# Patient Record
Sex: Female | Born: 1990 | Race: Black or African American | Hispanic: No | Marital: Single | Smoking: Current every day smoker
Health system: Southern US, Community
[De-identification: ages and names within clinical notes are randomized; demographics above are authoritative.]

## PROBLEM LIST (undated history)

## (undated) DIAGNOSIS — J302 Other seasonal allergic rhinitis: Secondary | ICD-10-CM

## (undated) HISTORY — PX: TONSILLECTOMY: SUR1361

## (undated) HISTORY — PX: EAR CYST EXCISION: SHX22

---

## 2012-07-10 ENCOUNTER — Encounter (HOSPITAL_COMMUNITY): Payer: Self-pay | Admitting: Pharmacy Technician

## 2012-07-10 NOTE — H&P (Signed)
  NTS SOAP Note  Vital Signs:  Vitals as of: 07/05/2012: Systolic 136: Diastolic 81: Heart Rate 81: Temp 98.58F: Height 6ft 4.5in: Weight 222Lbs 0 Ounces: Pain Level 5: BMI 38  BMI : 37.52 kg/m2  Subjective: This 81 Years 2 Months old Female presents for of keloid of right ear.  Had piercing done on right external tragus in past, has now formed keloid scar.  Review of Symptoms:  Constitutional:unremarkable   Head:unremarkable    Eyes:unremarkable   Nose/Mouth/Throat:unremarkable Cardiovascular:  unremarkable   Respiratory:unremarkable   Gastrointestinal:  unremarkable   Genitourinary:unremarkable     Musculoskeletal:unremarkable   as noted Hematolgic/Lymphatic:unremarkable     Allergic/Immunologic:unremarkable     Past Medical History:    Reviewed   Past Medical History  Surgical History: none Medical Problems: none Allergies: nkda Medications: none   Social History:Reviewed  Social History  Preferred Language: English ([place]) Race:  Black or African American Ethnicity: Not Hispanic / Latino Age: 63 Years 2 Months Marital Status:  S Alcohol:  No Recreational drug(s):  No   Smoking Status: Never smoker reviewed on 07/05/2012  Family History:  Reviewed   Family History  Is there a family history [id number]    Objective Information: General:  Well appearing, well nourished in no distress.      Large punduculated keloid on outer external tragus, with small keloid noted inside and opposite the larger keloid. Heart:  RRR, no murmur Lungs:    CTA bilaterally, no wheezes, rhonchi, rales.  Breathing unlabored.  Assessment:Keloid, right ear  Diagnosis &amp; Procedure:    Plan:Can be removed, but patient warned that keloid could come back.  Will call to schedule surgery if she wants.   Patient Education:Alternative treatments to surgery were discussed with patient (and family).  Risks  and benefits  of procedure were fully explained to the patient (and family) who gave informed consent. Patient/family questions were addressed.  Follow-up:Pending Surgery

## 2012-07-12 ENCOUNTER — Encounter (HOSPITAL_COMMUNITY): Payer: Self-pay

## 2012-07-12 ENCOUNTER — Encounter (HOSPITAL_COMMUNITY)
Admission: RE | Admit: 2012-07-12 | Discharge: 2012-07-12 | Disposition: A | Discharge status: Home / Self Care | Payer: PRIVATE HEALTH INSURANCE | Source: Ambulatory Visit | Attending: General Surgery | Admitting: General Surgery

## 2012-07-12 HISTORY — DX: Other seasonal allergic rhinitis: J30.2

## 2012-07-12 LAB — SURGICAL PCR SCREEN: Staphylococcus aureus: POSITIVE — AB

## 2012-07-12 NOTE — Patient Instructions (Addendum)
    21 Jasmine Mccullough  07/12/2012   Your procedure is scheduled on:  07/16/2012  Report to [facility] at  615  AM.  Call this number if you have problems the morning of surgery: [phone]   Remember:   Do not eat food:After Midnight.  May have clear liquids:until Midnight .    Take these medicines the morning of surgery with A SIP OF WATER:  none   Do not wear jewelry, make-up or nail polish.  Do not wear lotions, powders, or perfumes. You may wear deodorant.  Do not shave 48 hours prior to surgery. Men may shave face and neck.  Do not bring valuables to the hospital.  Contacts, dentures or bridgework may not be worn into surgery.  Leave suitcase in the car. After surgery it may be brought to your room.  For patients admitted to the hospital, checkout time is 11:00 AM the day of discharge.   Patients discharged the day of surgery will not be allowed to drive home.  Name and phone number of your driver: family  Special Instructions: Shower using CHG 2 nights before surgery and the night before surgery.  If you shower the day of surgery use CHG.  Use special wash - you have one bottle of CHG for all showers.  You should use approximately 1/3 of the bottle for each shower.   Please read over the following fact sheets that you were given: Pain Booklet, MRSA Information, Surgical Site Infection Prevention, Anesthesia Post-op Instructions and Care and Recovery After Surgery PATIENT INSTRUCTIONS POST-ANESTHESIA  IMMEDIATELY FOLLOWING SURGERY:  Do not drive or operate machinery for the first twenty four hours after surgery.  Do not make any important decisions for twenty four hours after surgery or while taking narcotic pain medications or sedatives.  If you develop intractable nausea and vomiting or a severe headache please notify your doctor immediately.  FOLLOW-UP:  Please make an appointment with your surgeon as instructed. You do not need to follow up with anesthesia unless specifically  instructed to do so.  WOUND CARE INSTRUCTIONS (if applicable):  Keep a dry clean dressing on the anesthesia/puncture wound site if there is drainage.  Once the wound has quit draining you may leave it open to air.  Generally you should leave the bandage intact for twenty four hours unless there is drainage.  If the epidural site drains for more than 36-48 hours please call the anesthesia department.  QUESTIONS?:  Please feel free to call your physician or the hospital operator if you have any questions, and they will be happy to assist you.

## 2012-07-16 ENCOUNTER — Encounter (HOSPITAL_COMMUNITY): Payer: Self-pay | Admitting: Anesthesiology

## 2012-07-16 ENCOUNTER — Encounter (HOSPITAL_COMMUNITY)
Admission: RE | Disposition: A | Discharge status: Home / Self Care | Payer: Self-pay | Source: Ambulatory Visit | Attending: General Surgery

## 2012-07-16 ENCOUNTER — Ambulatory Visit (HOSPITAL_COMMUNITY): Payer: PRIVATE HEALTH INSURANCE | Admitting: Anesthesiology

## 2012-07-16 ENCOUNTER — Encounter (HOSPITAL_COMMUNITY): Payer: Self-pay | Admitting: *Deleted

## 2012-07-16 ENCOUNTER — Ambulatory Visit (HOSPITAL_COMMUNITY)
Admission: RE | Admit: 2012-07-16 | Discharge: 2012-07-16 | Disposition: A | Discharge status: Home / Self Care | Payer: PRIVATE HEALTH INSURANCE | Source: Ambulatory Visit | Attending: General Surgery | Admitting: General Surgery

## 2012-07-16 DIAGNOSIS — L910 Hypertrophic scar: Secondary | ICD-10-CM | POA: Insufficient documentation

## 2012-07-16 DIAGNOSIS — Z01812 Encounter for preprocedural laboratory examination: Secondary | ICD-10-CM | POA: Insufficient documentation

## 2012-07-16 SURGERY — EXCISION, KELOID
Anesthesia: General | Site: Ear | Laterality: Right | Wound class: Clean

## 2012-07-16 MED ORDER — MIDAZOLAM HCL 2 MG/2ML IJ SOLN
1.0000 mg | INTRAMUSCULAR | Status: DC | PRN
  Administered 2012-07-16: 2 mg via INTRAVENOUS

## 2012-07-16 MED ORDER — BUPIVACAINE HCL (PF) 0.5 % IJ SOLN
INTRAMUSCULAR | Status: AC
  Filled 2012-07-16: qty 30

## 2012-07-16 MED ORDER — FENTANYL CITRATE 0.05 MG/ML IJ SOLN
INTRAMUSCULAR | Status: AC
  Filled 2012-07-16: qty 2

## 2012-07-16 MED ORDER — FENTANYL CITRATE 0.05 MG/ML IJ SOLN
25.0000 ug | INTRAMUSCULAR | Status: DC | PRN
  Administered 2012-07-16: 50 ug via INTRAVENOUS

## 2012-07-16 MED ORDER — BACITRACIN-NEOMYCIN-POLYMYXIN 400-5-5000 EX OINT
TOPICAL_OINTMENT | CUTANEOUS | Status: AC
Start: 2012-07-16 — End: 2012-07-16
  Filled 2012-07-16: qty 1

## 2012-07-16 MED ORDER — PROPOFOL 10 MG/ML IV BOLUS
INTRAVENOUS | Status: DC | PRN
  Administered 2012-07-16: 50 mg via INTRAVENOUS
  Administered 2012-07-16: 150 mg via INTRAVENOUS

## 2012-07-16 MED ORDER — FENTANYL CITRATE 0.05 MG/ML IJ SOLN
INTRAMUSCULAR | Status: DC | PRN
  Administered 2012-07-16: 100 ug via INTRAVENOUS

## 2012-07-16 MED ORDER — MIDAZOLAM HCL 2 MG/2ML IJ SOLN
INTRAMUSCULAR | Status: AC
  Filled 2012-07-16: qty 2

## 2012-07-16 MED ORDER — HYDROCODONE-ACETAMINOPHEN 5-325 MG PO TABS: 1.0000 | ORAL_TABLET | ORAL | Status: DC | PRN

## 2012-07-16 MED ORDER — LIDOCAINE HCL (PF) 1 % IJ SOLN
INTRAMUSCULAR | Status: AC
  Filled 2012-07-16: qty 5

## 2012-07-16 MED ORDER — BACITRACIN ZINC 500 UNIT/GM EX OINT
TOPICAL_OINTMENT | CUTANEOUS | Status: AC
  Filled 2012-07-16: qty 0.9

## 2012-07-16 MED ORDER — LACTATED RINGERS IV SOLN
INTRAVENOUS | Status: DC
  Administered 2012-07-16: 1000 mL via INTRAVENOUS

## 2012-07-16 MED ORDER — LIDOCAINE HCL (CARDIAC) 10 MG/ML IV SOLN
INTRAVENOUS | Status: DC | PRN
  Administered 2012-07-16: 20 mg via INTRAVENOUS

## 2012-07-16 MED ORDER — SODIUM CHLORIDE 0.9 % IR SOLN
Status: DC | PRN
  Administered 2012-07-16: 1000 mL

## 2012-07-16 MED ORDER — LIDOCAINE HCL (PF) 1 % IJ SOLN
INTRAMUSCULAR | Status: AC
  Filled 2012-07-16: qty 30

## 2012-07-16 MED ORDER — ONDANSETRON HCL 4 MG/2ML IJ SOLN: 4.0000 mg | Freq: Once | INTRAMUSCULAR | Status: DC | PRN

## 2012-07-16 MED ORDER — PROPOFOL 10 MG/ML IV EMUL
INTRAVENOUS | Status: AC
  Filled 2012-07-16: qty 20

## 2012-07-16 MED ORDER — KETOROLAC TROMETHAMINE 30 MG/ML IJ SOLN
30.0000 mg | Freq: Once | INTRAMUSCULAR | Status: AC
  Administered 2012-07-16: 30 mg via INTRAVENOUS

## 2012-07-16 MED ORDER — KETOROLAC TROMETHAMINE 30 MG/ML IJ SOLN
INTRAMUSCULAR | Status: AC
  Filled 2012-07-16: qty 1

## 2012-07-16 SURGICAL SUPPLY — 33 items
BAG HAMPER (MISCELLANEOUS) ×2 IMPLANT
CLOTH BEACON ORANGE TIMEOUT ST (SAFETY) ×2 IMPLANT
COTTON BALL STERILE (GAUZE/BANDAGES/DRESSINGS) ×2
COTTON BALL STERILE 2 PK (GAUZE/BANDAGES/DRESSINGS) ×1 IMPLANT
COVER LIGHT HANDLE STERIS (MISCELLANEOUS) ×4 IMPLANT
DECANTER SPIKE VIAL GLASS SM (MISCELLANEOUS) ×2 IMPLANT
DERMABOND ADVANCED (GAUZE/BANDAGES/DRESSINGS)
DERMABOND ADVANCED .7 DNX12 (GAUZE/BANDAGES/DRESSINGS) IMPLANT
ELECT NEEDLE TIP 2.8 STRL (NEEDLE) ×2 IMPLANT
ELECT REM PT RETURN 9FT ADLT (ELECTROSURGICAL) ×2
ELECTRODE REM PT RTRN 9FT ADLT (ELECTROSURGICAL) ×1 IMPLANT
FORMALIN 10 PREFIL 120ML (MISCELLANEOUS) ×2 IMPLANT
GLOVE BIO SURGEON STRL SZ7.5 (GLOVE) ×2 IMPLANT
GLOVE BIOGEL PI IND STRL 7.5 (GLOVE) ×2 IMPLANT
GLOVE BIOGEL PI INDICATOR 7.5 (GLOVE) ×2
GLOVE ECLIPSE 7.0 STRL STRAW (GLOVE) ×4 IMPLANT
GLOVE EXAM NITRILE MD LF STRL (GLOVE) ×2 IMPLANT
GOWN STRL REIN XL XLG (GOWN DISPOSABLE) ×4 IMPLANT
KIT ROOM TURNOVER APOR (KITS) ×2 IMPLANT
MANIFOLD NEPTUNE II (INSTRUMENTS) ×2 IMPLANT
NEEDLE HYPO 25X1 1.5 SAFETY (NEEDLE) IMPLANT
NS IRRIG 1000ML POUR BTL (IV SOLUTION) ×2 IMPLANT
PACK MINOR (CUSTOM PROCEDURE TRAY) ×2 IMPLANT
PAD ARMBOARD 7.5X6 YLW CONV (MISCELLANEOUS) ×2 IMPLANT
SET BASIN LINEN APH (SET/KITS/TRAYS/PACK) ×2 IMPLANT
SUT ETHILON 3 0 FSL (SUTURE) IMPLANT
SUT ETHILON 5 0 P 3 18 (SUTURE) ×2
SUT NYLON ETHILON 5-0 P-3 1X18 (SUTURE) ×2 IMPLANT
SUT PROLENE 4 0 PS 2 18 (SUTURE) IMPLANT
SUT VIC AB 3-0 SH 27 (SUTURE)
SUT VIC AB 3-0 SH 27X BRD (SUTURE) IMPLANT
SUT VIC AB 4-0 PS2 27 (SUTURE) IMPLANT
SYR CONTROL 10ML LL (SYRINGE) IMPLANT

## 2012-07-16 NOTE — Anesthesia Postprocedure Evaluation (Signed)
Anesthesia Post Note  Patient: Jasmine Mccullough  Procedure(s) Performed: Procedure(s) (LRB): EXCISION OF KELOID (Right)  Anesthesia type: General  Patient location: PACU  Post pain: Pain level controlled  Post assessment: Post-op Vital signs reviewed, Patient's Cardiovascular Status Stable, Respiratory Function Stable, Patent Airway, No signs of Nausea or vomiting and Pain level controlled  Last Vitals:  Filed Vitals:   07/16/12 0828  BP: 120/41  Pulse: 88  Temp: 37 C  Resp: 20    Post vital signs: Reviewed and stable  Level of consciousness: awake and alert   Complications: No apparent anesthesia complications

## 2012-07-16 NOTE — Anesthesia Preprocedure Evaluation (Signed)
Anesthesia Evaluation  Patient identified by MRN, date of birth, ID band Patient awake    Reviewed: Allergy & Precautions, H&P , NPO status , Patient's Chart, lab work & pertinent test results  History of Anesthesia Complications Negative for: history of anesthetic complications  Airway Mallampati: II TM Distance: >3 FB     Dental  (+) Teeth Intact   Pulmonary neg pulmonary ROS,  breath sounds clear to auscultation        Cardiovascular Rhythm:Regular     Neuro/Psych    GI/Hepatic negative GI ROS,   Endo/Other    Renal/GU      Musculoskeletal   Abdominal   Peds  Hematology   Anesthesia Other Findings   Reproductive/Obstetrics                           Anesthesia Physical Anesthesia Plan  ASA: I  Anesthesia Plan: General   Post-op Pain Management:    Induction: Intravenous  Airway Management Planned: LMA  Additional Equipment:   Intra-op Plan:   Post-operative Plan: Extubation in OR  Informed Consent: I have reviewed the patients History and Physical, chart, labs and discussed the procedure including the risks, benefits and alternatives for the proposed anesthesia with the patient or authorized representative who has indicated his/her understanding and acceptance.     Plan Discussed with:   Anesthesia Plan Comments:         Anesthesia Quick Evaluation

## 2012-07-16 NOTE — Op Note (Signed)
Patient:  [name]  DOB:  December 04, 1990  MRN:  [id number]   Preop Diagnosis:  Keloid, right ear  Postop Diagnosis:  Same  Procedure:  Excision of keloid, right ear  Surgeon:  Franky Macho, M.D.  Anes:  General  Indications:  Patient is a 21 year old black female who had a piercing along the superior aspect of the right ear through the cartilage who end up developing keloids on either side of the piercing. She has long since removed the piercing. She now comes the operating room for excision of the keloid. The risks and benefits of the procedure including the possibility of recurrence of the keloid were fully explained to the patient, gave informed consent.  Procedure note:  Patient is placed the supine position. After general anesthesia was administered, the right ear was prepped and draped using the usual sterile technique with Betadine. Surgical site confirmation was performed.  Both the larger hour and smaller inner keloids were excised using Bovie electrocautery. Further dissection was performed using sharp dissection in order to fully remove the keloid. Care was taken to avoid the cartilage. The outer skin was reapproximated using 5-0 nylon sutures. Neosporin ointment was applied.  All tape and needle counts were correct at the end of the procedure. The patient was awakened and transferred to PACU in stable condition.  Complications:  None  EBL:  None  Specimen:  Keloid, right ear

## 2012-07-16 NOTE — Interval H&P Note (Signed)
History and Physical Interval Note:  07/16/2012 7:26 AM  Jasmine Mccullough  has presented today for surgery, with the diagnosis of Keloid Right Ear  The various methods of treatment have been discussed with the patient and family. After consideration of risks, benefits and other options for treatment, the patient has consented to  Procedure(s) (LRB) with comments: EXCISION OF KELOID (Right) as a surgical intervention .  The patient's history has been reviewed, patient examined, no change in status, stable for surgery.  I have reviewed the patient's chart and labs.  Questions were answered to the patient's satisfaction.     Franky Macho A

## 2012-07-16 NOTE — Anesthesia Procedure Notes (Signed)
Procedure Name: LMA Insertion Date/Time: 07/16/2012 7:53 AM Performed by: Franco Nones Pre-anesthesia Checklist: Patient identified, Patient being monitored, Emergency Drugs available, Timeout performed and Suction available Patient Re-evaluated:Patient Re-evaluated prior to inductionOxygen Delivery Method: Circle System Utilized Preoxygenation: Pre-oxygenation with 100% oxygen Intubation Type: IV induction Ventilation: Mask ventilation without difficulty LMA: LMA inserted LMA Size: 4.0 Number of attempts: 1 Placement Confirmation: positive ETCO2 and breath sounds checked- equal and bilateral Tube secured with: Tape

## 2012-07-16 NOTE — Transfer of Care (Signed)
Immediate Anesthesia Transfer of Care Note  Patient: Jasmine Mccullough  Procedure(s) Performed: Procedure(s) (LRB): EXCISION OF KELOID (Right)  Patient Location: PACU  Anesthesia Type: General  Level of Consciousness: awake  Airway & Oxygen Therapy: Patient Spontanous Breathing and non-rebreather face mask  Post-op Assessment: Report given to PACU RN, Post -op Vital signs reviewed and stable and Patient moving all extremities  Post vital signs: Reviewed and stable  Complications: No apparent anesthesia complications

## 2012-07-17 MED ORDER — BACITRACIN-NEOMYCIN-POLYMYXIN 400-5-5000 EX OINT
TOPICAL_OINTMENT | CUTANEOUS | Status: AC | PRN
  Administered 2012-07-16: 1 via TOPICAL

## 2015-05-30 ENCOUNTER — Emergency Department (HOSPITAL_COMMUNITY): Payer: Medicaid Other

## 2015-05-30 ENCOUNTER — Inpatient Hospital Stay (HOSPITAL_COMMUNITY)
Admission: EM | Admit: 2015-05-30 | Discharge: 2015-06-03 | DRG: 777 | Disposition: A | Discharge status: Home / Self Care | Payer: Medicaid Other | Attending: Obstetrics & Gynecology | Admitting: Obstetrics & Gynecology

## 2015-05-30 ENCOUNTER — Encounter (HOSPITAL_COMMUNITY): Payer: Self-pay | Admitting: Emergency Medicine

## 2015-05-30 ENCOUNTER — Emergency Department (HOSPITAL_COMMUNITY): Payer: Medicaid Other | Admitting: Anesthesiology

## 2015-05-30 ENCOUNTER — Encounter (HOSPITAL_COMMUNITY)
Admission: EM | Disposition: A | Discharge status: Home / Self Care | Payer: Self-pay | Source: Home / Self Care | Attending: Obstetrics & Gynecology

## 2015-05-30 DIAGNOSIS — K913 Postprocedural intestinal obstruction: Secondary | ICD-10-CM | POA: Diagnosis not present

## 2015-05-30 DIAGNOSIS — O001 Tubal pregnancy: Secondary | ICD-10-CM | POA: Diagnosis present

## 2015-05-30 DIAGNOSIS — Z6841 Body Mass Index (BMI) 40.0 and over, adult: Secondary | ICD-10-CM | POA: Diagnosis not present

## 2015-05-30 DIAGNOSIS — O00101 Right tubal pregnancy without intrauterine pregnancy: Secondary | ICD-10-CM

## 2015-05-30 DIAGNOSIS — O2690 Pregnancy related conditions, unspecified, unspecified trimester: Secondary | ICD-10-CM

## 2015-05-30 DIAGNOSIS — D62 Acute posthemorrhagic anemia: Secondary | ICD-10-CM | POA: Diagnosis not present

## 2015-05-30 DIAGNOSIS — F17200 Nicotine dependence, unspecified, uncomplicated: Secondary | ICD-10-CM | POA: Diagnosis present

## 2015-05-30 DIAGNOSIS — K661 Hemoperitoneum: Secondary | ICD-10-CM | POA: Diagnosis present

## 2015-05-30 DIAGNOSIS — O009 Ectopic pregnancy, unspecified: Secondary | ICD-10-CM

## 2015-05-30 DIAGNOSIS — Z3A09 9 weeks gestation of pregnancy: Secondary | ICD-10-CM | POA: Diagnosis present

## 2015-05-30 DIAGNOSIS — K567 Ileus, unspecified: Secondary | ICD-10-CM | POA: Diagnosis not present

## 2015-05-30 DIAGNOSIS — D6489 Other specified anemias: Secondary | ICD-10-CM

## 2015-05-30 DIAGNOSIS — I959 Hypotension, unspecified: Secondary | ICD-10-CM

## 2015-05-30 DIAGNOSIS — K9189 Other postprocedural complications and disorders of digestive system: Secondary | ICD-10-CM

## 2015-05-30 HISTORY — PX: LAPAROSCOPY: SHX197

## 2015-05-30 LAB — CBC
HCT: 30 % — ABNORMAL LOW (ref 36.0–46.0)
HEMATOCRIT: 31.2 % — AB (ref 36.0–46.0)
HEMOGLOBIN: 10 g/dL — AB (ref 12.0–15.0)
Hemoglobin: 9.7 g/dL — ABNORMAL LOW (ref 12.0–15.0)
MCH: 26.5 pg (ref 26.0–34.0)
MCH: 26.7 pg (ref 26.0–34.0)
MCHC: 32.3 g/dL (ref 30.0–36.0)
MCHC: 32.1 g/dL (ref 30.0–36.0)
MCV: 82 fL (ref 78.0–100.0)
MCV: 83.2 fL (ref 78.0–100.0)
PLATELETS: 318 10*3/uL (ref 150–400)
Platelets: 242 10*3/uL (ref 150–400)
RBC: 3.66 MIL/uL — ABNORMAL LOW (ref 3.87–5.11)
RBC: 3.75 MIL/uL — ABNORMAL LOW (ref 3.87–5.11)
RDW: 15.3 % (ref 11.5–15.5)
RDW: 16.6 % — AB (ref 11.5–15.5)
WBC: 23.3 10*3/uL — AB (ref 4.0–10.5)
WBC: 17 10*3/uL — AB (ref 4.0–10.5)

## 2015-05-30 LAB — I-STAT CHEM 8, ED
BUN: 7 mg/dL (ref 6–20)
Calcium, Ion: 1.09 mmol/L — ABNORMAL LOW (ref 1.12–1.23)
Chloride: 107 mmol/L (ref 101–111)
Creatinine, Ser: 0.7 mg/dL (ref 0.44–1.00)
Glucose, Bld: 137 mg/dL — ABNORMAL HIGH (ref 65–99)
HCT: 25 % — ABNORMAL LOW (ref 36.0–46.0)
HEMOGLOBIN: 8.5 g/dL — AB (ref 12.0–15.0)
Potassium: 4.3 mmol/L (ref 3.5–5.1)
Sodium: 141 mmol/L (ref 135–145)
TCO2: 20 mmol/L (ref 0–100)

## 2015-05-30 LAB — URINALYSIS W MICROSCOPIC (NOT AT ARMC)
Glucose, UA: 100 mg/dL — AB
Hgb urine dipstick: NEGATIVE
Ketones, ur: NEGATIVE mg/dL
Leukocytes, UA: NEGATIVE
NITRITE: NEGATIVE
PH: 5.5 (ref 5.0–8.0)
PROTEIN: 100 mg/dL — AB
Specific Gravity, Urine: >1.03 — ABNORMAL HIGH (ref 1.005–1.030)
Urobilinogen, UA: 1 mg/dL (ref 0.0–1.0)

## 2015-05-30 LAB — COMPREHENSIVE METABOLIC PANEL
ALK PHOS: 50 U/L (ref 38–126)
ALT: 16 U/L (ref 14–54)
ANION GAP: 7 (ref 5–15)
AST: 17 U/L (ref 15–41)
Albumin: 3.4 g/dL — ABNORMAL LOW (ref 3.5–5.0)
BUN: 8 mg/dL (ref 6–20)
CHLORIDE: 107 mmol/L (ref 101–111)
CO2: 23 mmol/L (ref 22–32)
CREATININE: 1.1 mg/dL — AB (ref 0.44–1.00)
Calcium: 8.4 mg/dL — ABNORMAL LOW (ref 8.9–10.3)
GFR calc Af Amer: >60 mL/min (ref >60)
GLUCOSE: 226 mg/dL — AB (ref 65–99)
Potassium: 3.6 mmol/L (ref 3.5–5.1)
Sodium: 137 mmol/L (ref 135–145)
Total Bilirubin: 0.2 mg/dL — ABNORMAL LOW (ref 0.3–1.2)
Total Protein: 6.1 g/dL — ABNORMAL LOW (ref 6.5–8.1)

## 2015-05-30 LAB — PREPARE RBC (CROSSMATCH)

## 2015-05-30 LAB — ABO/RH
ABO/RH(D): O POS
ABO/RH(D): O POS

## 2015-05-30 LAB — POC URINE PREG, ED: Preg Test, Ur: POSITIVE — AB

## 2015-05-30 LAB — HCG, QUANTITATIVE, PREGNANCY: HCG, BETA CHAIN, QUANT, S: 20710 m[IU]/mL — AB (ref <5)

## 2015-05-30 LAB — LACTIC ACID, PLASMA
LACTIC ACID, VENOUS: 3.5 mmol/L — AB (ref 0.5–2.0)
Lactic Acid, Venous: 3.2 mmol/L (ref 0.5–2.0)

## 2015-05-30 SURGERY — LAPAROSCOPY OPERATIVE
Anesthesia: General | Site: Abdomen

## 2015-05-30 MED ORDER — OXYCODONE-ACETAMINOPHEN 5-325 MG PO TABS
1.0000 | ORAL_TABLET | ORAL | Status: DC | PRN
  Administered 2015-05-31 – 2015-06-02 (×8): 2 via ORAL
  Administered 2015-06-02 – 2015-06-03 (×2): 1 via ORAL
  Filled 2015-05-30: qty 1
  Filled 2015-05-30 (×3): qty 2
  Filled 2015-05-30: qty 1
  Filled 2015-05-30 (×5): qty 2
  Filled 2015-05-30: qty 1

## 2015-05-30 MED ORDER — PHENYLEPHRINE HCL 10 MG/ML IJ SOLN: INTRAMUSCULAR | Status: DC | PRN

## 2015-05-30 MED ORDER — DEXAMETHASONE SODIUM PHOSPHATE 10 MG/ML IJ SOLN
INTRAMUSCULAR | Status: DC | PRN
  Administered 2015-05-30: 4 mg via INTRAVENOUS

## 2015-05-30 MED ORDER — SODIUM CHLORIDE 0.9 % IV SOLN: 10.0000 mL/h | Freq: Once | INTRAVENOUS | Status: DC

## 2015-05-30 MED ORDER — ONDANSETRON HCL 4 MG/2ML IJ SOLN
INTRAMUSCULAR | Status: DC | PRN
  Administered 2015-05-30: 4 mg via INTRAVENOUS

## 2015-05-30 MED ORDER — NEOSTIGMINE METHYLSULFATE 10 MG/10ML IV SOLN
INTRAVENOUS | Status: DC | PRN
  Administered 2015-05-30: 4 mg via INTRAVENOUS

## 2015-05-30 MED ORDER — HYDROMORPHONE 0.3 MG/ML IV SOLN
INTRAVENOUS | Status: DC
  Administered 2015-05-30: 0.6 mg via INTRAVENOUS
  Administered 2015-05-31: 1.5 mg via INTRAVENOUS
  Administered 2015-05-31: 0.9 mg via INTRAVENOUS
  Filled 2015-05-30: qty 25

## 2015-05-30 MED ORDER — DIPHENHYDRAMINE HCL 50 MG/ML IJ SOLN: 12.5000 mg | Freq: Four times a day (QID) | INTRAMUSCULAR | Status: DC | PRN

## 2015-05-30 MED ORDER — FENTANYL CITRATE (PF) 100 MCG/2ML IJ SOLN
INTRAMUSCULAR | Status: DC | PRN
  Administered 2015-05-30: 25 ug via INTRAVENOUS
  Administered 2015-05-30 (×2): 50 ug via INTRAVENOUS
  Administered 2015-05-30: 150 ug via INTRAVENOUS
  Administered 2015-05-30 (×2): 50 ug via INTRAVENOUS
  Administered 2015-05-30 (×3): 25 ug via INTRAVENOUS
  Administered 2015-05-30: 100 ug via INTRAVENOUS

## 2015-05-30 MED ORDER — BUPIVACAINE HCL (PF) 0.5 % IJ SOLN
INTRAMUSCULAR | Status: DC | PRN
  Administered 2015-05-30: 30 mL

## 2015-05-30 MED ORDER — DIPHENHYDRAMINE HCL 12.5 MG/5ML PO ELIX: 12.5000 mg | ORAL_SOLUTION | Freq: Four times a day (QID) | ORAL | Status: DC | PRN

## 2015-05-30 MED ORDER — SUCCINYLCHOLINE CHLORIDE 20 MG/ML IJ SOLN
INTRAMUSCULAR | Status: DC | PRN
  Administered 2015-05-30: 120 mg via INTRAVENOUS

## 2015-05-30 MED ORDER — KETOROLAC TROMETHAMINE 30 MG/ML IJ SOLN: 30.0000 mg | Freq: Once | INTRAMUSCULAR | Status: DC

## 2015-05-30 MED ORDER — MENTHOL 3 MG MT LOZG
1.0000 | LOZENGE | OROMUCOSAL | Status: DC | PRN
  Filled 2015-05-30: qty 9

## 2015-05-30 MED ORDER — SODIUM CHLORIDE 0.9 % IV BOLUS (SEPSIS)
1000.0000 mL | Freq: Once | INTRAVENOUS | Status: AC
  Administered 2015-05-30: 1000 mL via INTRAVENOUS

## 2015-05-30 MED ORDER — CEFAZOLIN SODIUM-DEXTROSE 2-3 GM-% IV SOLR
2.0000 g | INTRAVENOUS | Status: DC
  Filled 2015-05-30: qty 50

## 2015-05-30 MED ORDER — ONDANSETRON HCL 4 MG/2ML IJ SOLN
4.0000 mg | Freq: Once | INTRAMUSCULAR | Status: AC
  Administered 2015-05-30: 4 mg via INTRAVENOUS
  Filled 2015-05-30: qty 2

## 2015-05-30 MED ORDER — ONDANSETRON HCL 4 MG PO TABS: 4.0000 mg | ORAL_TABLET | Freq: Four times a day (QID) | ORAL | Status: DC | PRN

## 2015-05-30 MED ORDER — ONDANSETRON HCL 4 MG/2ML IJ SOLN: 4.0000 mg | Freq: Four times a day (QID) | INTRAMUSCULAR | Status: DC | PRN

## 2015-05-30 MED ORDER — FENTANYL CITRATE (PF) 100 MCG/2ML IJ SOLN
50.0000 ug | Freq: Once | INTRAMUSCULAR | Status: AC
  Administered 2015-05-30: 50 ug via INTRAVENOUS
  Filled 2015-05-30: qty 2

## 2015-05-30 MED ORDER — PROPOFOL 10 MG/ML IV BOLUS
INTRAVENOUS | Status: DC | PRN
  Administered 2015-05-30: 200 mg via INTRAVENOUS

## 2015-05-30 MED ORDER — SIMETHICONE 80 MG PO CHEW
80.0000 mg | CHEWABLE_TABLET | Freq: Four times a day (QID) | ORAL | Status: DC | PRN
  Administered 2015-05-31: 80 mg via ORAL
  Filled 2015-05-30: qty 1

## 2015-05-30 MED ORDER — PROMETHAZINE HCL 25 MG/ML IJ SOLN: 6.2500 mg | INTRAMUSCULAR | Status: DC | PRN

## 2015-05-30 MED ORDER — ROCURONIUM BROMIDE 100 MG/10ML IV SOLN
INTRAVENOUS | Status: DC | PRN
  Administered 2015-05-30 (×2): 20 mg via INTRAVENOUS

## 2015-05-30 MED ORDER — GLYCOPYRROLATE 0.2 MG/ML IJ SOLN
INTRAMUSCULAR | Status: DC | PRN
  Administered 2015-05-30: 0.6 mg via INTRAVENOUS

## 2015-05-30 MED ORDER — HYDROMORPHONE HCL 1 MG/ML IJ SOLN: 0.2500 mg | INTRAMUSCULAR | Status: DC | PRN

## 2015-05-30 MED ORDER — PANTOPRAZOLE SODIUM 40 MG PO TBEC
40.0000 mg | DELAYED_RELEASE_TABLET | Freq: Every day | ORAL | Status: DC
  Administered 2015-05-31 – 2015-06-03 (×4): 40 mg via ORAL
  Filled 2015-05-30 (×4): qty 1

## 2015-05-30 MED ORDER — LACTATED RINGERS IV SOLN
INTRAVENOUS | Status: DC
  Administered 2015-05-31: 04:00:00 via INTRAVENOUS

## 2015-05-30 MED ORDER — MEPERIDINE HCL 25 MG/ML IJ SOLN: 6.2500 mg | INTRAMUSCULAR | Status: DC | PRN

## 2015-05-30 MED ORDER — IBUPROFEN 600 MG PO TABS
600.0000 mg | ORAL_TABLET | Freq: Four times a day (QID) | ORAL | Status: DC | PRN
  Administered 2015-05-31 – 2015-06-02 (×8): 600 mg via ORAL
  Filled 2015-05-30 (×8): qty 1

## 2015-05-30 MED ORDER — CEFAZOLIN SODIUM-DEXTROSE 2-3 GM-% IV SOLR
INTRAVENOUS | Status: DC | PRN
  Administered 2015-05-30: 2 g via INTRAVENOUS

## 2015-05-30 MED ORDER — LIDOCAINE HCL (CARDIAC) 20 MG/ML IV SOLN
INTRAVENOUS | Status: DC | PRN
  Administered 2015-05-30: 100 mg via INTRAVENOUS

## 2015-05-30 MED ORDER — MIDAZOLAM HCL 2 MG/2ML IJ SOLN
INTRAMUSCULAR | Status: DC | PRN
  Administered 2015-05-30 (×2): 1 mg via INTRAVENOUS

## 2015-05-30 MED ORDER — PHENYLEPHRINE HCL 10 MG/ML IJ SOLN
INTRAMUSCULAR | Status: DC | PRN
  Administered 2015-05-30 (×5): 40 ug via INTRAVENOUS
  Administered 2015-05-30: 80 ug via INTRAVENOUS
  Administered 2015-05-30: 40 ug via INTRAVENOUS
  Administered 2015-05-30: 80 ug via INTRAVENOUS
  Administered 2015-05-30: 40 ug via INTRAVENOUS
  Administered 2015-05-30: 80 ug via INTRAVENOUS
  Administered 2015-05-30: 40 ug via INTRAVENOUS
  Administered 2015-05-30 (×3): 80 ug via INTRAVENOUS

## 2015-05-30 MED ORDER — HYDROMORPHONE HCL 1 MG/ML IJ SOLN: 0.2000 mg | INTRAMUSCULAR | Status: DC | PRN

## 2015-05-30 MED ORDER — SODIUM CHLORIDE 0.9 % IJ SOLN: 9.0000 mL | INTRAMUSCULAR | Status: DC | PRN

## 2015-05-30 MED ORDER — NALOXONE HCL 0.4 MG/ML IJ SOLN: 0.4000 mg | INTRAMUSCULAR | Status: DC | PRN

## 2015-05-30 MED ORDER — LACTATED RINGERS IV SOLN
INTRAVENOUS | Status: DC | PRN
Start: 2015-05-30 — End: 2015-05-31
  Administered 2015-05-30: 18:00:00 via INTRAVENOUS

## 2015-05-30 MED ORDER — FENTANYL CITRATE (PF) 250 MCG/5ML IJ SOLN
INTRAMUSCULAR | Status: AC
Start: 2015-05-30 — End: 2015-05-30
  Filled 2015-05-30: qty 25

## 2015-05-30 MED ORDER — SODIUM CHLORIDE 0.9 % IV SOLN
INTRAVENOUS | Status: DC
  Administered 2015-05-30 (×2): via INTRAVENOUS

## 2015-05-30 SURGICAL SUPPLY — 38 items
CABLE HIGH FREQUENCY MONO STRZ (ELECTRODE) IMPLANT
CELLS DAT CNTRL 66122 CELL SVR (MISCELLANEOUS) ×1 IMPLANT
CLOTH BEACON ORANGE TIMEOUT ST (SAFETY) ×3 IMPLANT
COUNTER NEEDLE 1200 MAGNETIC (NEEDLE) ×3 IMPLANT
COVER TABLE BACK 60X90 (DRAPES) ×3 IMPLANT
DRSG COVADERM PLUS 2X2 (GAUZE/BANDAGES/DRESSINGS) ×3 IMPLANT
DRSG OPSITE POSTOP 3X4 (GAUZE/BANDAGES/DRESSINGS) ×3 IMPLANT
DURAPREP 26ML APPLICATOR (WOUND CARE) ×3 IMPLANT
GLOVE BIOGEL PI IND STRL 7.0 (GLOVE) ×2 IMPLANT
GLOVE BIOGEL PI INDICATOR 7.0 (GLOVE) ×4
GLOVE ECLIPSE 7.0 STRL STRAW (GLOVE) ×3 IMPLANT
GOWN STRL REUS W/TWL LRG LVL3 (GOWN DISPOSABLE) ×9 IMPLANT
LIQUID BAND (GAUZE/BANDAGES/DRESSINGS) ×3 IMPLANT
NEEDLE INSUFFLATION 120MM (ENDOMECHANICALS) ×3 IMPLANT
NS IRRIG 1000ML POUR BTL (IV SOLUTION) ×3 IMPLANT
PACK LAPAROSCOPY BASIN (CUSTOM PROCEDURE TRAY) ×3 IMPLANT
PAD ABD 8X7 1/2 STERILE (GAUZE/BANDAGES/DRESSINGS) ×3 IMPLANT
PAD POSITIONING PINK XL (MISCELLANEOUS) ×3 IMPLANT
PENCIL BUTTON HOLSTER BLD 10FT (ELECTRODE) ×3 IMPLANT
POUCH SPECIMEN RETRIEVAL 10MM (ENDOMECHANICALS) IMPLANT
RTRCTR WOUND ALEXIS 18CM MED (MISCELLANEOUS) ×3
SET IRRIG TUBING LAPAROSCOPIC (IRRIGATION / IRRIGATOR) IMPLANT
SHEARS HARMONIC ACE PLUS 36CM (ENDOMECHANICALS) IMPLANT
SHEET LAVH (DRAPES) ×3 IMPLANT
SPONGE GAUZE 4X4 12PLY STER LF (GAUZE/BANDAGES/DRESSINGS) ×3 IMPLANT
SPONGE LAP 18X18 X RAY DECT (DISPOSABLE) ×12 IMPLANT
SUT VIC AB 0 CT1 27 (SUTURE) ×16
SUT VIC AB 0 CT1 27XBRD ANBCTR (SUTURE) ×8 IMPLANT
SUT VICRYL 0 UR6 27IN ABS (SUTURE) ×6 IMPLANT
SUT VICRYL 4-0 PS2 18IN ABS (SUTURE) ×3 IMPLANT
TOWEL OR 17X24 6PK STRL BLUE (TOWEL DISPOSABLE) ×6 IMPLANT
TRAY FOLEY CATH SILVER 14FR (SET/KITS/TRAYS/PACK) ×3 IMPLANT
TROCAR XCEL NON-BLD 11X100MML (ENDOMECHANICALS) ×3 IMPLANT
TROCAR XCEL NON-BLD 5MMX100MML (ENDOMECHANICALS) ×6 IMPLANT
TUBING SUCTION BULK 100 FT (MISCELLANEOUS) ×3 IMPLANT
WARMER LAPAROSCOPE (MISCELLANEOUS) ×3 IMPLANT
WATER STERILE IRR 1000ML POUR (IV SOLUTION) IMPLANT
YANKAUER SUCT BULB TIP NO VENT (SUCTIONS) ×3 IMPLANT

## 2015-05-30 NOTE — MAU Note (Signed)
Patient presents from [facility] via EMS stretcher with ruptured ectopic on the right side. Blood hanging.

## 2015-05-30 NOTE — Anesthesia Preprocedure Evaluation (Signed)
Anesthesia Evaluation  Patient identified by MRN, date of birth, ID band Patient awake    Reviewed: Allergy & Precautions, H&P , Patient's Chart, lab work & pertinent test results  Airway Mallampati: II  TM Distance: >3 FB Neck ROM: full    Dental no notable dental hx. (+) Teeth Intact   Pulmonary Current Smoker,    Pulmonary exam normal       Cardiovascular negative cardio ROS      Neuro/Psych negative neurological ROS  negative psych ROS   GI/Hepatic negative GI ROS, Neg liver ROS,   Endo/Other  Morbid obesity  Renal/GU negative Renal ROS     Musculoskeletal   Abdominal (+) + obese,   Peds  Hematology negative hematology ROS (+)   Anesthesia Other Findings   Reproductive/Obstetrics negative OB ROS                             Anesthesia Physical Anesthesia Plan  ASA: III and emergent  Anesthesia Plan: General   Post-op Pain Management:    Induction: Intravenous  Airway Management Planned: Oral ETT  Additional Equipment:   Intra-op Plan:   Post-operative Plan: Extubation in OR  Informed Consent: I have reviewed the patients History and Physical, chart, labs and discussed the procedure including the risks, benefits and alternatives for the proposed anesthesia with the patient or authorized representative who has indicated his/her understanding and acceptance.   Dental Advisory Given  Plan Discussed with: CRNA and Surgeon  Anesthesia Plan Comments:         Anesthesia Quick Evaluation

## 2015-05-30 NOTE — ED Notes (Signed)
Pt remains in ultrasound.

## 2015-05-30 NOTE — ED Notes (Signed)
Pt transferred at 1639 from [facility] ED. Emergent blood started at this time. This RN accompanied [facility] with pt transport. V/S were obtained en route at appropriate intervals see [facility] documentation. No infusion reaction noted. Pt tolerated transfer well. Report given to receiving RN at time of arrival to [facility] approx 1715.  nad noted.

## 2015-05-30 NOTE — Op Note (Signed)
PROCEDURE DATE:05/30/2015  PREOPERATIVE DIAGNOSIS:  Ruptured right ectopic pregnancy at [redacted]w[redacted]d with large amount of hemoperitoneum POSTOPERATIVE DIAGNOSIS:  The same SURGEON:   Jaynie Collins, M.D. ANESTHESIOLOGIST: Leilani Able, M.D. OPERATION:  Diagnostic laparoscopy with conversion to Exploratory Laparotomy,  Right Salpingectomy   INDICATIONS: The patient is a 24 y.o. G1P0010 with ruptured right ectopic pregnancy at [redacted]w[redacted]d with large amount of hemoperitoneum who needed urgent surgical management. Please refer to the  preoperative notes for more details. The risks of surgery were again discussed with the patient including but not limited to: bleeding which may require transfusion or reoperation; infection which may require antibiotics; injury to surrounding organs; need for additional procedures; thromboembolic phenomenon, incisional problems and other postoperative/anesthesia complications. Written informed consent was obtained.    FINDINGS:  Large amount of hemoperitoneum estimated to be about 1500 ml of blood and clots.  Dilated right fallopian tube containing ectopic gestation; nonviable 9 week fetus noted to be out of the fallopian and surrounded by blood.  Small normal appearing uterus, normal left fallopian tube, left ovary and right ovary.  ANESTHESIA: General FLUIDS: 1200 ml of crystalloid; two units of pRBCs (already received one additional unit preoperatively) ESTIMATED BLOOD LOSS: 1500 ml all from hemoperitoneum; minimal operative blood loss URINE OUTPUT: 100 ml SPECIMENS: Right fallopian tube containing ectopic gestation sent to pathology COMPLICATIONS: None immediate  PROCEDURE IN DETAIL:  The patient was taken to the operating room where general anesthesia was administered and was found to be adequate. She also received preoperative antibiotics.  She was placed in the dorsal lithotomy position, and was prepped and draped in a sterile manner.  A Foley catheter was inserted into her  bladder and attached to constant drainage and a uterine manipulator was then advanced into the uterus .  After an adequate timeout was performed, attention was turned to the abdomen where an umbilical incision was made with the scalpel.  The Optiview 5-mm trocar and sleeve were then advanced without difficulty with the laparoscope under direct visualization into the abdomen.  The abdomen was then insufflated with carbon dioxide gas and adequate pneumoperitoneum was obtained.  A survey of the patient's pelvis and abdomen revealed the findings above.  It was very difficult to see anything with the large hemoperitoneum and the patient's vital signs became very concerning.  Given poor visualization, patient's hemodynamic status and concern for ongoing bleeding, the decision was made to convert to laparotomy. The abdomen was desufflated, and all instruments were removed.  The skin incision was closed with Dermabond.     Attention was then turned to her abdomen and a Pfannensteil skin incision was made with the scalpel. This incision was taken down to the fascia using electrocautery with care given to maintain good hemostasis. The fascia was incised in the midline and the fascial incision was then extended bilaterally using electrocautery without difficulty. The fascia was then dissected off the underlying rectus muscles using blunt and sharp dissection. The rectus muscles were split bluntly in the midline and the peritoneum entered sharply without complication. This peritoneal incision was then extended superiorly and inferiorly with care given to prevent bowel or bladder injury.  Attention was then turned to the pelvis. There were large clots and over one liter of blood that was evacuated at this point. The bowel was packed away with moist laparotomy sponges, and an Alexis retractor was placed. After further suctioning of blood and removal of the nonviable fetus that was noted; the ruptured right fallopian tube was  visualized.  Kelly clamps were used placed on the underlying mesosalpinx and uterine attachment; and the right fallopian tube was excised.  The pedicle was doubly suture ligated with 0 Vicryl.   Good hemostasis was noted.  The pelvis was irrigated and hemostasis was reconfirmed at all pedicles.  All laparotomy sponges and instruments were removed from the abdomen. The peritoneum and rectus muscles were reapproximated with a 0 Vicryl interrupted stitch, and the fascia was also closed in a running fashion with 0 Vicryl suture.  The subcutaneous layer was irrigated and injected with 30 ml of 0.5% Marcaine. The skin was closed with a 4-0 Vicryl subcuticular stitch. Sponge, lap, needle, and instrument counts were correct times three. The patient was taken to the recovery area awake, extubated and in stable condition.    Jaynie Collins, MD, FACOG Attending Obstetrician & Gynecologist [facility]

## 2015-05-30 NOTE — H&P (Signed)
Preoperative History and Physical  Jasmine Mccullough is a 24 y.o. G1P0 here for surgical management of ruptured ectopic pregnancy with hemoperitoneum.  Patient presented to [facility] earlier today with acute worsening of lower abdominal pain; reported lower abdominal pain, nausea, vomiting, fatigue and scant vaginal bleeding x 2 weeks.  LMP 04/23/2015.  On evaluation at [facility], she was noted to be pregnant and hypotensive. She was given fluid boluses and blood transfusion was started that improved her hemodynamic status, and ultrasound revealed significant hemoperitoneum associated with a [redacted]w[redacted]d nonviable ectopic pregnancy possibly in the right adnexal region.  The plan was then made to send her here to [facility] for surgical management.  Proposed surgery: Laparoscopic salpingectomy with removal of ectopic pregnancy, possible laparotomy.  Past Medical History  Diagnosis Date  . Seasonal allergies    Past Surgical History  Procedure Laterality Date  . Tonsillectomy      as a child  . Ear cyst excision     OB History  Gravida Para Term Preterm AB SAB TAB Ectopic Multiple Living  # Outcome Date GA Lbr Len/2nd Weight Sex Delivery Anes PTL Lv  1 Ectopic             Patient denies any other pertinent gynecologic issues.   Current Facility-Administered Medications on File Prior to Encounter  Medication Dose Route Frequency Provider Last Rate Last Dose  . neomycin-bacitracin-polymyxin (NEOSPORIN) ointment    PRN Franky Macho, MD   1 application at 07/16/12 0815   Current Outpatient Prescriptions on File Prior to Encounter  Medication Sig Dispense Refill  . HYDROcodone-acetaminophen (NORCO) 5-325 MG per tablet Take 1-2 tablets by mouth every 4 (four) hours as needed for pain. (Patient not taking: Reported on 05/30/2015) 30 tablet 0   No Known Allergies  Social History:   reports that she has been smoking.  She does not have any smokeless tobacco history on file. She  reports that she does not drink alcohol or use illicit drugs.  History reviewed. No pertinent family history.  Review of Systems: Noncontributory  PHYSICAL EXAM: Blood pressure 129/70, pulse 112, temperature 97.7 F (36.5 C), temperature source Oral, resp. rate 30, height  (1.626 m), weight 250 lb (113.399 kg), last menstrual period 04/23/2015, SpO2 100 %. CONSTITUTIONAL: Well-developed, well-nourished female in no acute distress.  HENT:  Normocephalic, atraumatic, External right and left ear normal. Oropharynx is clear and moist EYES: Conjunctivae and EOM are normal. Pupils are equal, round, and reactive to light. No scleral icterus.  NECK: Normal range of motion, supple, no masses SKIN: Skin is warm and dry. No rash noted. Not diaphoretic. No erythema. No pallor. NEUROLGIC: Alert and oriented to person, place, and time. Normal reflexes, muscle tone coordination. No cranial nerve deficit noted. PSYCHIATRIC: Normal mood and affect. Normal behavior. Normal judgment and thought content. CARDIOVASCULAR: Normal heart rate noted, regular rhythm RESPIRATORY: Effort and breath sounds normal, no problems with respiration noted ABDOMEN: Soft, moderate tenderness to palpation, positive guarding and rebound noted, moderately distended. PELVIC: Scant bleeding noted per vagina. MUSCULOSKELETAL: Normal range of motion. No edema and no tenderness. 2+ distal pulses.  Labs: Results for orders placed or performed during the hospital encounter of 05/30/15 (from the past 336 hour(s))  Comprehensive metabolic panel   Collection Time: 05/30/15 12:13 PM  Result Value Ref Range   Sodium 137 135 - 145 mmol/L   Potassium 3.6 3.5 - 5.1  mmol/L   Chloride 107 101 - 111 mmol/L   CO2 23 22 - 32 mmol/L   Glucose, Bld 226 (H) 65 - 99 mg/dL   BUN 8 6 - 20 mg/dL   Creatinine, Ser 1.61 (H) 0.44 - 1.00 mg/dL   Calcium 8.4 (L) 8.9 - 10.3 mg/dL   Total Protein 6.1 (L) 6.5 - 8.1 g/dL   Albumin 3.4 (L) 3.5 - 5.0  g/dL   AST 17 15 - 41 U/L   ALT 16 14 - 54 U/L   Alkaline Phosphatase 50 38 - 126 U/L   Total Bilirubin 0.2 (L) 0.3 - 1.2 mg/dL   GFR calc non Af Amer >60 >60 mL/min   GFR calc Af Amer >60 >60 mL/min   Anion gap 7 5 - 15  CBC   Collection Time: 05/30/15 12:13 PM  Result Value Ref Range   WBC 23.3 (H) 4.0 - 10.5 K/uL   RBC 3.66 (L) 3.87 - 5.11 MIL/uL   Hemoglobin 9.7 (L) 12.0 - 15.0 g/dL   HCT 09.6 (L) 04.5 - 40.9 %   MCV 82.0 78.0 - 100.0 fL   MCH 26.5 26.0 - 34.0 pg   MCHC 32.3 30.0 - 36.0 g/dL   RDW 81.1 91.4 - 78.2 %   Platelets 318 150 - 400 K/uL  ABO/Rh   Collection Time: 05/30/15 12:53 PM  Result Value Ref Range   ABO/RH(D) O POS   hCG, quantitative, pregnancy   Collection Time: 05/30/15 12:58 PM  Result Value Ref Range   hCG, Beta Chain, Quant, S 20710 (H) <5 mIU/mL  Lactic acid, plasma   Collection Time: 05/30/15 12:58 PM  Result Value Ref Range   Lactic Acid, Venous 3.2 (HH) 0.5 - 2.0 mmol/L  Type and screen   Collection Time: 05/30/15 12:58 PM  Result Value Ref Range   ABO/RH(D) O POS    Antibody Screen NEG    Sample Expiration 06/02/2015    Unit Number [id number]    Blood Component Type RED CELLS,LR    Unit division 00    Status of Unit ALLOCATED    Transfusion Status OK TO TRANSFUSE    Crossmatch Result Compatible    Unit Number [id number]    Blood Component Type RED CELLS,LR    Unit division 00    Status of Unit ISSUED    Transfusion Status OK TO TRANSFUSE    Crossmatch Result Compatible   POC urine preg, ED (not at [facility])   Collection Time: 05/30/15  1:11 PM  Result Value Ref Range   Preg Test, Ur POSITIVE (A) NEGATIVE  Urinalysis with microscopic   Collection Time: 05/30/15  1:13 PM  Result Value Ref Range   Color, Urine AMBER (A) YELLOW   APPearance HAZY (A) CLEAR   Specific Gravity, Urine >1.030 (H) 1.005 - 1.030   pH 5.5 5.0 - 8.0   Glucose, UA 100 (A) NEGATIVE mg/dL   Hgb urine dipstick NEGATIVE NEGATIVE   Bilirubin Urine SMALL (A)  NEGATIVE   Ketones, ur NEGATIVE NEGATIVE mg/dL   Protein, ur 401 (A) NEGATIVE mg/dL   Urobilinogen, UA 1.0 0.0 - 1.0 mg/dL   Nitrite NEGATIVE NEGATIVE   Leukocytes, UA NEGATIVE NEGATIVE   WBC, UA 3-6 <3 WBC/hpf   RBC / HPF 0-2 <3 RBC/hpf   Bacteria, UA MANY (A) RARE   Squamous Epithelial / LPF RARE RARE   Urine-Other MUCOUS PRESENT   Lactic acid, plasma   Collection Time: 05/30/15  3:15 PM  Result  Value Ref Range   Lactic Acid, Venous 3.5 (HH) 0.5 - 2.0 mmol/L  Prepare RBC   Collection Time: 05/30/15  4:00 PM  Result Value Ref Range   Order Confirmation ORDER PROCESSED BY BLOOD BANK   I-stat chem 8, ed   Collection Time: 05/30/15  4:17 PM  Result Value Ref Range   Sodium 141 135 - 145 mmol/L   Potassium 4.3 3.5 - 5.1 mmol/L   Chloride 107 101 - 111 mmol/L   BUN 7 6 - 20 mg/dL   Creatinine, Ser 1.61 0.44 - 1.00 mg/dL   Glucose, Bld 096 (H) 65 - 99 mg/dL   Calcium, Ion 0.45 (L) 1.12 - 1.23 mmol/L   TCO2 20 0 - 100 mmol/L   Hemoglobin 8.5 (L) 12.0 - 15.0 g/dL   HCT 40.9 (L) 81.1 - 91.4 %    Imaging Studies: 05/30/2015   OBSTETRIC <14 WK [place] AND TRANSVAGINAL OB US  CLINICAL DATA:  Early pregnancy with pelvic plane and vaginal bleeding.  COMPARISON:  None.  FINDINGS: Intrauterine gestational sac: No visualized intrauterine gestation.  There is a gestational sac with surrounding soft tissue thickening adjacent to the uterus in the region of the right fallopian tube or adnexa . A distorted appearing embryo is present without detectable cardiac activity. No yolk sac is seen.  CRL:  22.7  mm   9 w   0 d  [Entire ectopic and surrounding tissues measures about 5 cm x 5 cm in size] The uterus measures approximately 9.1 x 4.8 x 5.0 cm. There is some thickening of the endometrium, measuring 13 mm. The true right and left ovaries are not visualized on the study. There is a moderate to large amount of free fluid in the pelvis which appears echogenic and likely represents hemorrhagic fluid.   IMPRESSION: Findings positive for right-sided tubal/adnexal ectopic pregnancy. Discrete gestational sac is present outside of the uterus with an embryo identified. No cardiac activity is seen. Estimated gestational age of the embryo is 9 weeks. Associated complex fluid in the pelvis likely represents hemorrhagic fluid.  Critical Value/emergent results were called by telephone at the time of interpretation on 05/30/2015 at 2:52 pm to Dr. Valeria Batman, who verbally acknowledged these results.   Electronically Signed   By: Irish Lack M.D.   On: 05/30/2015 14:56   Assessment: Patient Active Problem List   Diagnosis Date Noted  . Ruptured right tubal ectopic pregnancy with hemoperitoneum 05/30/2015    Plan: Patient will undergo urgent surgical management with laparoscopic salpingectomy with removal of ectopic pregnancy; possible laparotomy given size of ectopic pregnancy.   The risks of surgery were discussed in detail with the patient including but not limited to: bleeding which may require transfusion or reoperation; infection which may require antibiotics; injury to surrounding organs which may involve bowel, bladder, ureters; need for additional procedures; thromboembolic phenomenon, surgical site problems and other postoperative/anesthesia complications. Likelihood of success in alleviating the patient's condition was discussed. Routine postoperative instructions will be reviewed with the patient and her family in detail after surgery.  The patient concurred with the proposed plan, giving informed written consent for the surgery.  Patient has been NPO since last night she will remain NPO for procedure.  Anesthesia and OR aware.  Preoperative prophylactic antibiotics and SCDs ordered on call to the OR.  To OR when ready.  Jaynie Collins, M.D. 05/30/2015 5:40 PM

## 2015-05-30 NOTE — ED Notes (Signed)
Pt reports lower abdominal pain,n/v, fatigue, vaginal bleeding when wipes x2 weeks. Pt reports LMP 04-23-15. Pt reports last unprotected intercourse x1 week.

## 2015-05-30 NOTE — Transfer of Care (Signed)
Immediate Anesthesia Transfer of Care Note  Patient: Jasmine Mccullough  Procedure(s) Performed: Procedure(s): DIAGNOSTIC LAPAROSCOPY. LAPAROTOMY WITH REMOVAL OF ECTOPIC PREGNANCY (N/A)  Patient Location: PACU  Anesthesia Type:General  Level of Consciousness: awake, alert  and oriented  Airway & Oxygen Therapy: Patient Spontanous Breathing and Patient connected to nasal cannula oxygen  Post-op Assessment: Report given to RN and Post -op Vital signs reviewed and stable  Post vital signs: Reviewed and stable  Last Vitals:  Filed Vitals:   05/30/15 1725  BP: 129/70  Pulse: 112  Temp: 36.5 C  Resp: 30    Complications: No apparent anesthesia complications

## 2015-05-30 NOTE — ED Notes (Signed)
NS bolus started. Pt tolerating well.

## 2015-05-30 NOTE — ED Provider Notes (Signed)
CSN: [id number]     Arrival date & time 05/30/15  1154 History  This chart was scribed for Jasmine Memos, MD by Ronney Lion, ED Scribe. This patient was seen in room APA04/AP[facility]04 and the patient's care was started at 12:33 PM.    Chief Complaint  Patient presents with  . Abdominal Pain   The history is provided by the patient. No language interpreter was used.    HPI Comments: Jasmine Mccullough is a 24 y.o. female who presents to the Emergency Department complaining of sudden onset, severe suprapubic abdominal pain that began this morning. Patient states she was driving and felt suddenly very nauseated, feeling immediately like she needed to pull over. Patient then went home and vomited what describes looked like "just saliva." She states she drank water, which she vomited immediately. Patient was last normal last night. She denies any suspicious food intake. She has tried nothing for her symptoms; no modifying factors were noted. Patient also notes vaginal bleeding for the past 2 weeks. She states she is sexually active and uses no contraceptive methods, although she denies a possibility of pregnancy. She states her LMP was 04/23/15. She states she last urinated and had a normal BM this morning. She denies use of any daily medications. Patient reports known allergies to seafood, which causes her throat to swell up. Patient is an Government social research officer for [employer]. She denies any problems with flatus.   Past Medical History  Diagnosis Date  . Seasonal allergies    Past Surgical History  Procedure Laterality Date  . Tonsillectomy      as a child  . Ear cyst excision     History reviewed. No pertinent family history. Social History  Substance Use Topics  . Smoking status: Current Every Day Smoker -- 0.50 packs/day  . Smokeless tobacco: None  . Alcohol Use: No   OB History    Gravida Para Term Preterm AB TAB SAB Ectopic Multiple Living   1    1   1        Review of Systems  Gastrointestinal:  Positive for nausea, vomiting and abdominal pain.  Genitourinary: Positive for vaginal bleeding.  All other systems reviewed and are negative.  Allergies  Review of patient's allergies indicates no known allergies.  Home Medications   Prior to Admission medications   Medication Sig Start Date End Date Taking? Authorizing Provider  HYDROcodone-acetaminophen (NORCO) 5-325 MG per tablet Take 1-2 tablets by mouth every 4 (four) hours as needed for pain. Patient not taking: Reported on 05/30/2015 07/16/12   Franky Macho, MD   BP 98/66 mmHg  Pulse 104  Temp(Src) 98.7 F (37.1 C) (Oral)  Resp 25  Ht 5\' 4"  (1.626 m)  Wt 250 lb (113.399 kg)  BMI 42.89 kg/m2  SpO2 100%  LMP 04/23/2015 Physical Exam  Constitutional: She is oriented to person, place, and time. She appears well-developed and well-nourished. No distress.  HENT:  Head: Normocephalic and atraumatic.  Eyes: Conjunctivae and EOM are normal.  Neck: Neck supple. No tracheal deviation present.  Cardiovascular: Regular rhythm.  Tachycardia present.   No murmur heard. Tachycardic.  Pulmonary/Chest: Effort normal. No respiratory distress.  Abdominal: She exhibits distension. There is tenderness. There is guarding. There is no rebound.  Diffuse abdominal tenderness--although pt states the worst pain is in her suprapubic area--with guarding. No rebound. Slight distension.  Musculoskeletal: Normal range of motion.  Neurological: She is alert and oriented to person, place, and time.  Alert, oriented.  Skin: Skin is warm and dry.  Psychiatric: She has a normal mood and affect. Her behavior is normal.  Nursing note and vitals reviewed.   ED Course  Procedures (including critical care time)  CRITICAL CARE Performed by: Jasmine Mccullough   Total critical care time: 64 minutes  Critical care time was exclusive of separately billable procedures and treating other patients.  Critical care was necessary to treat or prevent imminent or  life-threatening deterioration.  Critical care was time spent personally by me on the following activities: development of treatment plan with patient and/or surrogate as well as nursing, discussions with consultants, evaluation of patient's response to treatment, examination of patient, obtaining history from patient or surrogate, ordering and performing treatments and interventions, ordering and review of laboratory studies, ordering and review of radiographic studies, pulse oximetry and re-evaluation of patient's condition.   DIAGNOSTIC STUDIES: Oxygen Saturation is 96% on RA, normal by my interpretation.    COORDINATION OF CARE: 12:38 PM - Suspicious for ectopic pregnancy. Discussed treatment plan with pt at bedside which includes diagnostic tests and imaging, and administering some pain medication here. Will also perform pelvic exam. Pt verbalized understanding and agreed to plan.   Labs Review Labs Reviewed  COMPREHENSIVE METABOLIC PANEL - Abnormal; Notable for the following:    Glucose, Bld 226 (*)    Creatinine, Ser 1.10 (*)    Calcium 8.4 (*)    Total Protein 6.1 (*)    Albumin 3.4 (*)    Total Bilirubin 0.2 (*)    All other components within normal limits  CBC - Abnormal; Notable for the following:    WBC 23.3 (*)    RBC 3.66 (*)    Hemoglobin 9.7 (*)    HCT 30.0 (*)    All other components within normal limits  HCG, QUANTITATIVE, PREGNANCY - Abnormal; Notable for the following:    hCG, Beta Chain, Sharene Butters, S 20710 (*)    All other components within normal limits  LACTIC ACID, PLASMA - Abnormal; Notable for the following:    Lactic Acid, Venous 3.2 (*)    All other components within normal limits  LACTIC ACID, PLASMA - Abnormal; Notable for the following:    Lactic Acid, Venous 3.5 (*)    All other components within normal limits  URINALYSIS W MICROSCOPIC - Abnormal; Notable for the following:    Color, Urine AMBER (*)    APPearance HAZY (*)    Specific Gravity, Urine  >1.030 (*)    Glucose, UA 100 (*)    Bilirubin Urine SMALL (*)    Protein, ur 100 (*)    Bacteria, UA MANY (*)    All other components within normal limits  POC URINE PREG, ED - Abnormal; Notable for the following:    Preg Test, Ur POSITIVE (*)    All other components within normal limits  I-STAT CHEM 8, ED - Abnormal; Notable for the following:    Glucose, Bld 137 (*)    Calcium, Ion 1.09 (*)    Hemoglobin 8.5 (*)    HCT 25.0 (*)    All other components within normal limits  TYPE AND SCREEN  PREPARE RBC (CROSSMATCH)  ABO/RH    Imaging Review US Ob Comp Less 14 Wks  05/30/2015   CLINICAL DATA:  Early pregnancy with pelvic plane and vaginal bleeding.  EXAM: OBSTETRIC <14 WK [place] AND TRANSVAGINAL OB US  TECHNIQUE: Both transabdominal and transvaginal ultrasound examinations were performed for complete evaluation of the gestation as well  as the maternal uterus, adnexal regions, and pelvic cul-de-sac. Transvaginal technique was performed to assess early pregnancy.  COMPARISON:  None.  FINDINGS: Intrauterine gestational sac: No visualized intrauterine gestation.  There is a gestational sac with surrounding soft tissue thickening adjacent to the uterus in the region of the right fallopian tube or adnexa. A distorted appearing embryo is present without detectable cardiac activity. No yolk sac is seen.  CRL:  22.7  mm   9 w   0 d  The uterus measures approximately 9.1 x 4.8 x 5.0 cm. There is some thickening of the endometrium, measuring 13 mm. The true right and left ovaries are not visualized on the study. There is a moderate to large amount of free fluid in the pelvis which appears echogenic and likely represents hemorrhagic fluid.  IMPRESSION: Findings positive for right-sided tubal/adnexal ectopic pregnancy. Discrete gestational sac is present outside of the uterus with an embryo identified. No cardiac activity is seen. Estimated gestational age of the embryo is 9 weeks. Associated complex fluid in  the pelvis likely represents hemorrhagic fluid.  Critical Value/emergent results were called by telephone at the time of interpretation on 05/30/2015 at 2:52 pm to Dr. Valeria Batman, who verbally acknowledged these results.   Electronically Signed   By: Irish Lack M.D.   On: 05/30/2015 14:56   US Transvaginal Non-ob  05/30/2015   CLINICAL DATA:  Early pregnancy with pelvic plane and vaginal bleeding.  EXAM: OBSTETRIC <14 WK [place] AND TRANSVAGINAL OB US  TECHNIQUE: Both transabdominal and transvaginal ultrasound examinations were performed for complete evaluation of the gestation as well as the maternal uterus, adnexal regions, and pelvic cul-de-sac. Transvaginal technique was performed to assess early pregnancy.  COMPARISON:  None.  FINDINGS: Intrauterine gestational sac: No visualized intrauterine gestation.  There is a gestational sac with surrounding soft tissue thickening adjacent to the uterus in the region of the right fallopian tube or adnexa. A distorted appearing embryo is present without detectable cardiac activity. No yolk sac is seen.  CRL:  22.7  mm   9 w   0 d  The uterus measures approximately 9.1 x 4.8 x 5.0 cm. There is some thickening of the endometrium, measuring 13 mm. The true right and left ovaries are not visualized on the study. There is a moderate to large amount of free fluid in the pelvis which appears echogenic and likely represents hemorrhagic fluid.  IMPRESSION: Findings positive for right-sided tubal/adnexal ectopic pregnancy. Discrete gestational sac is present outside of the uterus with an embryo identified. No cardiac activity is seen. Estimated gestational age of the embryo is 9 weeks. Associated complex fluid in the pelvis likely represents hemorrhagic fluid.  Critical Value/emergent results were called by telephone at the time of interpretation on 05/30/2015 at 2:52 pm to Dr. Valeria Batman, who verbally acknowledged these results.   Electronically Signed   By: Irish Lack M.D.    On: 05/30/2015 14:56      EKG Interpretation None      MDM   Final diagnoses:  Ruptured right tubal ectopic pregnancy with hemoperitoneum  Hypotension, unspecified hypotension type  Anemia due to other cause   24 yo female without significant PMH who presented with abdominal pain, nausea, vomiting starting abruptly this AM. Exam with evidence of surgical abdomen, soft BP and tachycardia with history of Unprotected intercourse initial concern was ruptured ectopic pregnancy. Also high on differential was appendicitis but less likely. Also AAA v colitis v gastroenteritis were much further down.  Patient fluid resuscitated and initially improved her BP and HR. Pain improved with doses of fentanyl however even on multiple reevaluations still with e/o peritonitis. Labs sent to include lactic acid and type/screen and upreg. As soon as upreg positive an ultrasound done confirming right fallopian ectopic. Lactic acid resulted at 3.1 which further confirmed that the patient was in shock so another liter of fluids given and obstetrics was contacted for transfer for emergent surgery. Repeat BP's still stable however lactic acid repeat was 3.5 so the patient was transfused 2 units of type specific blood which was [facility]. Patient was stabilized to the best of my ability prior to transfer and I felt the risk of deterioration en route was outweighed by the benefit of getting to her definitive care.    I personally performed the services described in this documentation, which was scribed in my presence. The recorded information has been reviewed and is accurate.      Jasmine Memos, MD 05/31/15 [phone]

## 2015-05-30 NOTE — ED Notes (Signed)
Critical lab: Lactic Acid 3.2  Dr. Clayborne Dana informed.

## 2015-05-30 NOTE — Anesthesia Procedure Notes (Signed)
Procedure Name: Intubation Date/Time: 05/30/2015 7:16 PM Performed by: Jayda White, Jannet Askew Pre-anesthesia Checklist: Patient identified, Emergency Drugs available, Suction available, Patient being monitored and Timeout performed Patient Re-evaluated:Patient Re-evaluated prior to inductionOxygen Delivery Method: Circle system utilized Intubation Type: IV induction Ventilation: Mask ventilation without difficulty Laryngoscope Size: Mac and 3 Grade View: Grade I Tube type: Oral Tube size: 7.0 mm Number of attempts: 1 Placement Confirmation: ETT inserted through vocal cords under direct vision,  positive ETCO2 and breath sounds checked- equal and bilateral Secured at: 22 cm Dental Injury: Teeth and Oropharynx as per pre-operative assessment

## 2015-05-31 LAB — CBC
HCT: 30.8 % — ABNORMAL LOW (ref 36.0–46.0)
Hemoglobin: 10.4 g/dL — ABNORMAL LOW (ref 12.0–15.0)
MCH: 27.2 pg (ref 26.0–34.0)
MCHC: 33.8 g/dL (ref 30.0–36.0)
MCV: 80.4 fL (ref 78.0–100.0)
Platelets: 204 10*3/uL (ref 150–400)
RBC: 3.83 MIL/uL — ABNORMAL LOW (ref 3.87–5.11)
RDW: 16.2 % — ABNORMAL HIGH (ref 11.5–15.5)
WBC: 18.5 10*3/uL — ABNORMAL HIGH (ref 4.0–10.5)

## 2015-05-31 LAB — TYPE AND SCREEN
ABO/RH(D): O POS
ANTIBODY SCREEN: NEGATIVE
Unit division: 0
Unit division: 0

## 2015-05-31 NOTE — Progress Notes (Signed)
1 Day Post-Op Procedure(s) (LRB): DIAGNOSTIC LAPAROSCOPY. LAPAROTOMY , RIGHT SALPINGECTOMY WITH REMOVAL OF ECTOPIC PREGNANCY for ruptured right ectopic pregnancy at [redacted]w[redacted]d with large amount of hemoperitoneum  Subjective: Patient reports incisional pain, tolerating PO and no problems voiding.  Has been ambulating. No flatus yet.  Objective: I have reviewed patient's vital signs, intake and output, medications and labs.  Temp:  [97.6 F (36.4 C)-98.7 F (37.1 C)] 98.6 F (37 C) (08/14 0609) Pulse Rate:  [70-112] 97 (08/14 0609) Resp:  [13-30] 20 (08/14 0617) BP: (91-148)/(43-84) 148/76 mmHg (08/14 0609) SpO2:  [96 %-100 %] 99 % (08/14 0617) Weight:  [250 lb (113.399 kg)] 250 lb (113.399 kg) (08/13 1207)  08/13 0701 - 08/14 0700 In: 3050 [P.O.:680; I.V.:1700; Blood:670] Out: 2400 [Urine:900; Blood:1500]  General: alert and no distress Resp: clear to auscultation bilaterally Cardio: regular rate and rhythm GI: soft, non-tender; bowel sounds normal; no masses,  no organomegaly and incision: clean, dry, intact and scant drainage present Extremities: extremities normal, atraumatic, no cyanosis or edema and Homans sign is negative, no sign of DVT Vaginal Bleeding: minimal   CBC Latest Ref Rng 05/31/2015 05/30/2015 05/30/2015  WBC 4.0 - 10.5 K/uL 18.5(H) 17.0(H) -  Hemoglobin 12.0 - 15.0 g/dL 10.4(L) 10.0(L) 8.5(L)  Hematocrit 36.0 - 46.0 % 30.8(L) 31.2(L) 25.0(L)  Platelets 150 - 400 K/uL 204 242 -  Patient received 3 units of pRBCs  Assessment: s/p Procedure(s): DIAGNOSTIC LAPAROSCOPY. LAPAROTOMY WITH REMOVAL OF ECTOPIC PREGNANCY (N/A): stable, progressing well and tolerating diet  Plan: Advance diet Encourage ambulation Advance to PO medication Discontinue IV fluids  Stable hemoglobin, no further intervention needed after 3 units of pRBCs that patient already received Continue routine postoperative care Plan to discharge tomorrow if remains stable   LOS: 1 day     Fintan Grater A, MD 05/31/2015, 7:30 AM

## 2015-05-31 NOTE — Anesthesia Postprocedure Evaluation (Signed)
  Anesthesia Post-op Note  Patient: Jasmine Mccullough  Procedure(s) Performed: Procedure(s): DIAGNOSTIC LAPAROSCOPY. LAPAROTOMY WITH REMOVAL OF ECTOPIC PREGNANCY (N/A)  Patient Location: [facility]  Anesthesia Type:General  Level of Consciousness: awake, alert  and oriented  Airway and Oxygen Therapy: Patient Spontanous Breathing  Post-op Pain: none  Post-op Assessment: Post-op Vital signs reviewed, Patient's Cardiovascular Status Stable and No signs of Nausea or vomiting              Post-op Vital Signs: Reviewed and stable  Last Vitals:  Filed Vitals:   05/31/15 0617  BP:   Pulse:   Temp:   Resp: 20    Complications: No apparent anesthesia complications

## 2015-05-31 NOTE — Anesthesia Postprocedure Evaluation (Signed)
  Anesthesia Post-op Note  Patient: Jasmine Mccullough  Procedure(s) Performed: Procedure(s): DIAGNOSTIC LAPAROSCOPY. LAPAROTOMY WITH REMOVAL OF ECTOPIC PREGNANCY (N/A)  Patient Location: PACU  Anesthesia Type:General  Level of Consciousness: awake  Airway and Oxygen Therapy: Patient Spontanous Breathing  Post-op Pain: mild  Post-op Assessment: Post-op Vital signs reviewed, Patient's Cardiovascular Status Stable, Respiratory Function Stable, Patent Airway, No signs of Nausea or vomiting and Pain level controlled              Post-op Vital Signs: Reviewed and stable  Last Vitals:  Filed Vitals:   05/31/15 1039  BP: 130/49  Pulse: 97  Temp: 37.1 C  Resp: 18    Complications: No apparent anesthesia complications

## 2015-05-31 NOTE — Transfer of Care (Signed)
Immediate Anesthesia Transfer of Care Note  Patient: Jasmine Mccullough  Procedure(s) Performed: Procedure(s): DIAGNOSTIC LAPAROSCOPY. LAPAROTOMY WITH REMOVAL OF ECTOPIC PREGNANCY (N/A)  Patient Location: PACU  Anesthesia Type:General  Level of Consciousness: awake, alert  and oriented  Airway & Oxygen Therapy: Patient Spontanous Breathing and Patient connected to nasal cannula oxygen  Post-op Assessment: Report given to RN and Post -op Vital signs reviewed and stable  Post vital signs: Reviewed and stable  Last Vitals:  Filed Vitals:   05/31/15 0200  BP:   Pulse:   Temp:   Resp: 18    Complications: No apparent anesthesia complications

## 2015-06-01 ENCOUNTER — Encounter (HOSPITAL_COMMUNITY): Payer: Self-pay | Admitting: Obstetrics & Gynecology

## 2015-06-01 MED ORDER — IBUPROFEN 600 MG PO TABS: 600.0000 mg | ORAL_TABLET | Freq: Four times a day (QID) | ORAL | Status: DC | PRN

## 2015-06-01 MED ORDER — BISACODYL 10 MG RE SUPP
10.0000 mg | Freq: Every day | RECTAL | Status: DC | PRN
  Administered 2015-06-01 – 2015-06-02 (×2): 10 mg via RECTAL
  Filled 2015-06-01 (×2): qty 1

## 2015-06-01 MED ORDER — DOCUSATE SODIUM 100 MG PO CAPS: 100.0000 mg | ORAL_CAPSULE | Freq: Two times a day (BID) | ORAL | Status: DC | PRN

## 2015-06-01 MED ORDER — OXYCODONE-ACETAMINOPHEN 5-325 MG PO TABS: 1.0000 | ORAL_TABLET | ORAL | Status: DC | PRN

## 2015-06-01 NOTE — Discharge Instructions (Signed)
Laparotomy Care After Refer to this sheet in the next few weeks. These instructions provide you with information on caring for yourself after your procedure. Your caregiver may also give you more specific instructions. Your treatment has been planned according to current medical practices, but problems sometimes occur. Call your caregiver if you have any problems or questions after your procedure. HOME CARE INSTRUCTIONS ACTIVITY  Rest as much as possible the first two weeks at home.  Avoid strenuous activity such as heavy lifting (more than 10 pounds), pushing, or pulling. Limit stair climbing to once or twice a day for the first week, then slowly increase this activity.  Take frequent rest periods throughout the day.  Talk with your caregiver about when you may resume your usual physical activity.  You need to be out of bed and walking as much as possible. This decreases the chance of:  Blood clots.  Pneumonia. NUTRITION  You can resume your normal diet once you regain bowel function.  Drink plenty of fluids (6-8 glasses a day or as instructed by your caregiver).  Eat a well-balanced diet.  Daily portions of food from the meat (protein), milk, vegetable, and bread groups are necessary for your health. ELIMINATION It is very important not to strain during bowel movements. If constipation should occur, you may:  Take a mild laxative.  Add fruit and bran to your diet.  Drink more fluids. HYGIENE  Take showers, not baths, until 4 weeks after surgery.  If your incision is closed, you may take a shower or tub bath. FEVER If you feel feverish or have shaking chills, take your temperature. If it is 102 F (38.9 C), call your caregiver. The fever may mean there is an infection. PAIN CONTROL  Mild discomfort may occur.  Only take over-the-counter or prescription medicines for pain, discomfort, or fever as directed by your caregiver. Take any prescribed medicines exactly as  directed. INCISION CARE  Keep your incision site clean with soap and water.  Do not use a dressing unless your cut (incision) from surgery is draining or irritated.  If you have small adhesive strips in place and they do not fall off within 10 days, carefully peel them off.  Check your incision and surrounding area daily for any redness, swelling, discoloration, heavy drainage, or separation of the skin. SEXUAL INTERCOURSE Do not have sexual intercourse until after your follow-up appointment, unless your caregiver tells you otherwise. SEEK MEDICAL CARE IF:   You are unable to tolerate food or drinks.  You are unable to pass gas or have a bowel movement.  Your pain becomes more severe or is not relieved with medicines.  You have redness, swelling, discoloration, heavy drainage, or separation of the skin at the incision site. Document Released: 05/17/2004 Document Revised: 09/19/2012 Document Reviewed: 10/02/2007 [facility] Patient Information 2015 [place], [place]. This information is not intended to replace advice given to you by your health care provider. Make sure you discuss any questions you have with your health care provider.  Ruptured Ectopic Pregnancy An ectopic pregnancy is when the fertilized egg attaches (implants) outside the uterus. Most ectopic pregnancies occur in the fallopian tube. Rarely do ectopic pregnancies occur on the ovary, intestine, pelvis, or cervix. An ectopic pregnancy does not have the ability to develop into a normal, healthy baby.  A ruptured ectopic pregnancy is one in which the fallopian tube gets torn or bursts and results in internal bleeding. Often there is intense abdominal pain, and sometimes, vaginal bleeding.  Having an ectopic pregnancy can be a life-threatening experience. If left untreated, this dangerous condition can lead to a blood transfusion, abdominal surgery, or even death.  CAUSES  Damage to the fallopian tubes is the suspected cause in most  ectopic pregnancies.  RISK FACTORS Depending on your circumstances, the amount of risk of having an ectopic pregnancy will vary. There are 3 categories that may help you identify whether you are potentially at risk. High Risk  You have gone through infertility treatment.  You have had a previous ectopic pregnancy.  You have had previous tubal surgery.  You have had previous surgery to have the fallopian tubes tied (tubal ligation).  You have tubal problems or diseases.  You have been exposed to DES. DES is a medicine that was used until 1971 and had effects on babies whose mothers took the medicine.  You become pregnant while using an intrauterine device (IUD) for birth control. Moderate Risk  You have a history of infertility.  You have a history of a sexually transmitted infection (STI).  You have a history of pelvic inflammatory disease (PID).  You have scarring from endometriosis.  You have multiple sexual partners.  You smoke. Low Risk  You have had previous pelvic surgery.  You use vaginal douching.  You became sexually active before 23 years of age. SYMPTOMS An ectopic pregnancy should be suspected in anyone who has missed a period and has abdominal pain or bleeding.  You may experience normal pregnancy symptoms, such as:  Nausea.  Tiredness.  Breast tenderness.  Symptoms that are not normal include:  Pain with intercourse.  Irregular vaginal bleeding or spotting.  Cramping or pain on one side, or in the lower abdomen.  Fast heartbeat.  Passing out while having a bowel movement.  Symptoms of a ruptured ectopic pregnancy and internal bleeding may include:  Sudden, severe pain in the abdomen and pelvis.  Dizziness or fainting.  Pain in the shoulder area. DIAGNOSIS  Tests that may be performed include:  A pregnancy test.  An ultrasound.  Testing the specific level of pregnancy hormone in the bloodstream.  Taking a sample of uterus  tissue (dilation and curettage, D&C).  Surgery to perform a visual exam of the inside of the abdomen using a lighted tube (laparoscopy). TREATMENT  Laparoscopic surgery or abdominal surgery is recommended for a ruptured ectopic pregnancy.   The whole fallopian tube may need to be removed (salpingectomy).  If the tube is not too damaged, the tube may be saved, and the pregnancy will be surgically removed. Intime, the tube may still function.  If you have lost a lot of blood, you may need a blood transfusion.  You may receive a Rho (D) immune globulin shot if you are Rh negative and the father is Rh positive, or if you do not know the Rh type of the father. This is to prevent problems with any future pregnancy. SEEK IMMEDIATE MEDICAL CARE IF:  You have any symptoms of an ectopic or ruptured ectopic pregnancy. This is a medical emergency. MAKE SURE YOU:  Understand these instructions.  Will watch your condition.  Will get help right away if you are not doing well or get worse. Document Released: 09/30/2000 Document Revised: 10/08/2013 Document Reviewed: 07/15/2013 [facility] Patient Information 2015 [place], [place]. This information is not intended to replace advice given to you by your health care provider. Make sure you discuss any questions you have with your health care provider.

## 2015-06-01 NOTE — Progress Notes (Signed)
2 Days Post-Op Procedure(s) (LRB): DIAGNOSTIC LAPAROSCOPY. LAPAROTOMY , RIGHT SALPINGECTOMY WITH REMOVAL OF ECTOPIC PREGNANCY for ruptured right ectopic pregnancy at [redacted]w[redacted]d with large amount of hemoperitoneum  Subjective: Patient reports incisional pain, tolerating PO and no problems voiding.   Feels very distended and not able to pass some gas.  Able to eat food without nausea and vomiting. Has been ambulating.  Objective: I have reviewed patient's vital signs, intake and output, medications and labs.  Temp:  [98.3 F (36.8 C)-98.8 F (37.1 C)] 98.6 F (37 C) (08/15 0558) Pulse Rate:  [64-100] 90 (08/15 0558) Resp:  [18-20] 18 (08/15 0558) BP: (114-142)/(43-57) 126/57 mmHg (08/15 0558) SpO2:  [99 %-100 %] 100 % (08/15 0558)  08/14 0701 - 08/15 0700 In: 1260.4 [P.O.:600; I.V.:660.4] Out: 1000 [Urine:1000]  General: alert and no distress GI: soft, mild tenderness noted and mild abdominal distention; bowel sounds normal; no masses,  no organomegaly and incision: clean, dry, intact and scant drainage present with Honeycomb dressing in place Extremities: extremities normal, atraumatic, no cyanosis or edema and Homans sign is negative, no sign of DVT Vaginal Bleeding: minimal   CBC Latest Ref Rng 05/31/2015 05/30/2015 05/30/2015  WBC 4.0 - 10.5 K/uL 18.5(H) 17.0(H) -  Hemoglobin 12.0 - 15.0 g/dL 10.4(L) 10.0(L) 8.5(L)  Hematocrit 36.0 - 46.0 % 30.8(L) 31.2(L) 25.0(L)  Platelets 150 - 400 K/uL 204 242 -  Patient received 3 units of pRBCs on 05/30/15  Assessment: s/p Procedure(s): DIAGNOSTIC LAPAROSCOPY. LAPAROTOMY WITH REMOVAL OF ECTOPIC PREGNANCY (N/A): stable, progressing well and tolerating diet; but concerned about bowel function with absence of flatus. She has a high rsk of developing ileus given the amount of hemoperitoneum at admission.    Plan: Advance diet as tolerated Dulcolax already given to patient; will continue to await flatus Encourage ambulation Advance to PO  medication Discontinue IV fluids  Stable hemoglobin, no further intervention needed after 3 units of pRBCs that patient already received Continue routine postoperative care Plan to discharge later today or tomorrow once she has flatus and remains stable   LOS: 2 days    Kalleigh Harbor A, MD 06/01/2015, 2:11 PM

## 2015-06-02 ENCOUNTER — Inpatient Hospital Stay (HOSPITAL_COMMUNITY): Payer: Medicaid Other

## 2015-06-02 MED ORDER — POLYETHYLENE GLYCOL 3350 17 G PO PACK
17.0000 g | PACK | Freq: Every day | ORAL | Status: DC | PRN
  Administered 2015-06-02: 17 g via ORAL
  Filled 2015-06-02: qty 1

## 2015-06-02 NOTE — Clinical Documentation Improvement (Deleted)
Possible Clinical Conditions:  - Acute Blood Loss Anemia  - Other type of anemia  - Other condition  - Unable to clinically determine  Clinical Information: Patient admitted with ruptured tubal pregnancy. Hemoperitoneum documented Estimated Blood Loss all from hemoperitoneum per dictated op note - 1500 mls Patient has received PRBCs this admission    Thank You, Jerral Ralph ,RN Clinical Documentation Specialist:  [phone] [facility]- Health Information Management

## 2015-06-02 NOTE — Progress Notes (Signed)
3 Days Post-Op Procedure(s) (LRB): DIAGNOSTIC LAPAROSCOPY. LAPAROTOMY , RIGHT SALPINGECTOMY WITH REMOVAL OF ECTOPIC PREGNANCY for ruptured right ectopic pregnancy at [redacted]w[redacted]d with large amount of hemoperitoneum  Subjective: Patient reports mild incisional pain, tolerating PO and no problems voiding.   Feels a little distended.  Continues to not be able to pass flatus despite dulcolax, prune juice, apple juice, etc.  Able to eat food without nausea and vomiting. Has been ambulating.  Objective: I have reviewed patient's vital signs, intake and output, medications and labs.  Temp:  [97.4 F (36.3 C)-98.8 F (37.1 C)] 97.8 F (36.6 C) (08/16 0608) Pulse Rate:  [78-104] 78 (08/16 0608) Resp:  [16-18] 18 (08/16 0608) BP: (106-132)/(49-79) 106/64 mmHg (08/16 0608) SpO2:  [98 %-100 %] 100 % (08/16 1610)     General: alert and no distress Heart: regular rate, no murmur Lungs: clear to auscultation bilaterally, no wheezing. GI: soft, mild tenderness noted and mild abdominal distention; bowel sounds normal; no masses,  no organomegaly and incision: clean, dry, intact and scant drainage present with Honeycomb dressing in place Extremities: extremities normal, atraumatic, no cyanosis or edema and Homans sign is negative, no sign of DVT Vaginal Bleeding: minimal   CBC Latest Ref Rng 05/31/2015 05/30/2015 05/30/2015  WBC 4.0 - 10.5 K/uL 18.5(H) 17.0(H) -  Hemoglobin 12.0 - 15.0 g/dL 10.4(L) 10.0(L) 8.5(L)  Hematocrit 36.0 - 46.0 % 30.8(L) 31.2(L) 25.0(L)  Platelets 150 - 400 K/uL 204 242 -  Patient received 3 units of pRBCs on 05/30/15  Assessment: s/p Procedure(s): DIAGNOSTIC LAPAROSCOPY. LAPAROTOMY WITH REMOVAL OF ECTOPIC PREGNANCY (N/A): stable, progressing well and tolerating diet; but concerned about bowel function with absence of flatus. She has a high rsk of developing ileus given the amount of hemoperitoneum at admission.    Plan: Advance diet as tolerated Repeat Dulcolax.   Encourage  ambulation Advance to PO medication - minimize narcotics Discontinue IV fluids  Stable hemoglobin, no further intervention needed after 3 units of pRBCs that patient already received Continue routine postoperative care Plan to discharge later today once she has flatus and remains stable   LOS: 3 days   Jasmine Heritage, DO 06/02/2015, 6:59 AM

## 2015-06-03 DIAGNOSIS — D62 Acute posthemorrhagic anemia: Secondary | ICD-10-CM | POA: Diagnosis present

## 2015-06-03 DIAGNOSIS — K567 Ileus, unspecified: Secondary | ICD-10-CM | POA: Diagnosis not present

## 2015-06-03 DIAGNOSIS — K9189 Other postprocedural complications and disorders of digestive system: Secondary | ICD-10-CM

## 2015-06-03 LAB — TYPE AND SCREEN
ABO/RH(D): O POS
ANTIBODY SCREEN: NEGATIVE
UNIT DIVISION: 0
UNIT DIVISION: 0
Unit division: 0
Unit division: 0

## 2015-06-03 NOTE — Discharge Summary (Signed)
Gynecology Physician Postoperative Discharge Summary  Patient ID: Jasmine Mccullough MRN: [id number] DOB/AGE: 10-Nov-1990 24 y.o.  Admit Date: 05/30/2015 Discharge Date: 06/03/2015  Admission Diagnoses: Ruptured right ectopic pregnancy at [redacted]w[redacted]d with large amount of hemoperitoneum  Procedures: DIAGNOSTIC LAPAROSCOPY. LAPAROTOMY WITH REMOVAL OF ECTOPIC PREGNANCY Tranfusion of three units of packed RBCs  Discharge Diagnoses:  Ruptured right tubal ectopic pregnancy with hemoperitoneum s/p right salpingectomy  Acute blood loss anemia s/p transfusion of 3 units of packed RBCs  Ileus, postoperative    Significant Labs: CBC Latest Ref Rng 05/31/2015 05/30/2015 05/30/2015  WBC 4.0 - 10.5 K/uL 18.5(H) 17.0(H) -  Hemoglobin 12.0 - 15.0 g/dL 10.4(L) 10.0(L) 8.5(L)  Hematocrit 36.0 - 46.0 % 30.8(L) 31.2(L) 25.0(L)  Platelets 150 - 400 K/uL 204 242 -   Pathology  Diagnosis Fallopian tube, ectopic pregnancy, ruptured, with right fallopian tube - DILATED FALLOPIAN TUBE WITH FETAL TISSUE WITH FOOT LENGTH OF 0.4 CM, CONSISTENT WITH GESTATIONAL AGE OF APPROXIMATELY 8 WEEKS.   Imaging Studies 06/02/2015   ABDOMEN - 1 VIEW CLINICAL DATA: No flatus.  Abdominal distention. Postop from right salpingectomy for ectopic pregnancy.  COMPARISON:  None.  FINDINGS: Mild gaseous distention of nondependent colon is seen, consistent with mild colonic ileus. No evidence of dilated small bowel loops. Small amount of postoperative soft tissue gas noted within the pelvis.  IMPRESSION: Mild colonic ileus pattern.   Electronically Signed   By: Myles Rosenthal M.D.   On: 06/02/2015 11:01   05/30/2015   OBSTETRIC <14 WK [place] AND TRANSVAGINAL OB US CLINICAL DATA:  Early pregnancy with pelvic plane and vaginal bleeding.  TECHNIQUE: Both transabdominal and transvaginal ultrasound examinations were performed for complete evaluation of the gestation as well as the maternal uterus, adnexal regions, and pelvic cul-de-sac. Transvaginal technique was  performed to assess early pregnancy.  COMPARISON:  None.  FINDINGS: Intrauterine gestational sac: No visualized intrauterine gestation.  There is a gestational sac with surrounding soft tissue thickening adjacent to the uterus in the region of the right fallopian tube or adnexa. A distorted appearing embryo is present without detectable cardiac activity. No yolk sac is seen.  CRL:  22.7  mm   9 w   0 d  The uterus measures approximately 9.1 x 4.8 x 5.0 cm. There is some thickening of the endometrium, measuring 13 mm. The true right and left ovaries are not visualized on the study. There is a moderate to large amount of free fluid in the pelvis which appears echogenic and likely represents hemorrhagic fluid.  IMPRESSION: Findings positive for right-sided tubal/adnexal ectopic pregnancy. Discrete gestational sac is present outside of the uterus with an embryo identified. No cardiac activity is seen. Estimated gestational age of the embryo is 9 weeks. Associated complex fluid in the pelvis likely represents hemorrhagic fluid.  Critical Value/emergent results were called by telephone at the time of interpretation on 05/30/2015 at 2:52 pm to Dr. Valeria Batman, who verbally acknowledged these results.   Electronically Signed   By: Irish Lack M.D.   On: 05/30/2015 14:56   Hospital Course:  Jasmine Mccullough is a 24 y.o. G1P0010  admitted for urgent surgery in the setting of ruptured right ectopic pregnancy at [redacted]w[redacted]d with large amount of hemoperitoneum; symptomatic acute blood loss anemia .  She underwent the procedures including transfusion of three units of pRBCs as mentioned above, her operation was uncomplicated.  For further details about surgery, please refer to the operative report. Patient had a postoperative course complicated by mild  colonic ileus and initial lack of flatus despite tolerating regular diet; this was confirmed on abdominal X-ray done on POD#3.  This was attributed to the bowel inflammation due to  the large of amount of hemoperitoneum at admission.  There was no flatus/BM until late on POD#3 after multiple laxatives. By time of discharge on POD#4, her pain was controlled on oral pain medications; she was ambulating, voiding without difficulty, tolerating regular diet, passing flatus and having bowel movements. She was deemed stable for discharge to home.   Discharge Exam: Blood pressure 99/54, pulse 74, temperature 98.2 F (36.8 C), temperature source Oral, resp. rate 15, height  (1.626 m), weight 250 lb (113.399 kg), last menstrual period 04/23/2015, SpO2 100 %. General appearance: alert and no distress  Resp: clear to auscultation bilaterally  Cardio: regular rate and rhythm  GI: soft, non-tender; bowel sounds normal; no masses, no organomegaly.  Incision: C/D/I, no erythema, no drainage noted Pelvic: scant blood on pad  Extremities: extremities normal, atraumatic, no cyanosis or edema and Homans sign is negative, no sign of DVT  Discharged Condition: Stable  Disposition: 01-Home or Self Care     Medication List    TAKE these medications        docusate sodium 100 MG capsule  Commonly known as:  COLACE  Take 1 capsule (100 mg total) by mouth 2 (two) times daily as needed.     HYDROcodone-acetaminophen 5-325 MG per tablet  Commonly known as:  NORCO  Take 1-2 tablets by mouth every 4 (four) hours as needed for pain.     ibuprofen 600 MG tablet  Commonly known as:  ADVIL,MOTRIN  Take 1 tablet (600 mg total) by mouth every 6 (six) hours as needed (mild pain).     oxyCODONE-acetaminophen 5-325 MG per tablet  Commonly known as:  PERCOCET/ROXICET  Take 1-2 tablets by mouth every 4 (four) hours as needed for severe pain (moderate to severe pain (when tolerating fluids)).       Follow-up Information    Follow up with Tereso Newcomer, MD On 07/01/2015.   Specialty:  Obstetrics and Gynecology   Why:  12:45 pm for postoperative appointment.  Call clinic/come to [facility] for  any concerning issues   Contact information:   [address] [place] [address] [phone]       Signed:  Jaynie Collins, MD, FACOG Attending Obstetrician & Gynecologist Faculty Practice, [facility]

## 2015-06-03 NOTE — Progress Notes (Signed)
Pt is discharged in the care of friend,per ambulatory with [employer]. Denies any pain or discomfort. Dressing is clean and dry/ Discharge instructions with Rx were given to pt with good understanding. No equipment needed for home use. Stable.

## 2015-06-12 ENCOUNTER — Encounter: Payer: Self-pay | Admitting: *Deleted

## 2015-06-12 ENCOUNTER — Telehealth: Payer: Self-pay | Admitting: *Deleted

## 2015-06-12 NOTE — Telephone Encounter (Signed)
Received message on nurse line left on 06/11/15 at 1555.  Patient states she is ready to go back to work.  States she doesn't do any heavy lifting.  Wants to know if return now is okay.  Spoke with Dr. Erin Fulling.  Patient had surgery 05/30/15 for ruptured ectopic.  Okay to return to work.  Phoned patient verified again she does no heavy lifting.  Patient states she doesn't.  Requests to return to work on Monday 06/15/15.  Letter typed for patient.  She will pick up at the front desk shortly.  No further questions.

## 2015-07-01 ENCOUNTER — Encounter: Payer: Self-pay | Admitting: Obstetrics & Gynecology

## 2015-07-01 ENCOUNTER — Ambulatory Visit (INDEPENDENT_AMBULATORY_CARE_PROVIDER_SITE_OTHER): Payer: Self-pay | Admitting: Obstetrics & Gynecology

## 2015-07-01 VITALS — BP 135/77 | HR 82 | Temp 98.4°F | Ht 64.0 in | Wt 223.7 lb

## 2015-07-01 DIAGNOSIS — O00101 Right tubal pregnancy without intrauterine pregnancy: Secondary | ICD-10-CM

## 2015-07-01 DIAGNOSIS — Z09 Encounter for follow-up examination after completed treatment for conditions other than malignant neoplasm: Secondary | ICD-10-CM

## 2015-07-01 DIAGNOSIS — Z9889 Other specified postprocedural states: Secondary | ICD-10-CM

## 2015-07-01 DIAGNOSIS — O009 Ectopic pregnancy, unspecified: Secondary | ICD-10-CM

## 2015-07-01 DIAGNOSIS — K661 Hemoperitoneum: Secondary | ICD-10-CM

## 2015-07-01 DIAGNOSIS — Z3041 Encounter for surveillance of contraceptive pills: Secondary | ICD-10-CM

## 2015-07-01 MED ORDER — NORGESTIMATE-ETH ESTRADIOL 0.25-35 MG-MCG PO TABS: 1.0000 | ORAL_TABLET | Freq: Every day | ORAL | Status: AC

## 2015-07-01 MED ORDER — NORGESTIM-ETH ESTRAD TRIPHASIC 0.18/0.215/0.25 MG-25 MCG PO TABS: 1.0000 | ORAL_TABLET | Freq: Every day | ORAL | Status: DC

## 2015-07-01 NOTE — Progress Notes (Signed)
   CLINIC ENCOUNTER NOTE  History:  24 y.o. G1P0010 here today for postoperative check s/p  ELAP, right salpingectomy on 05/30/2015 for ruptured right tubal ectopic pregnancy.  Patient had significant blood loss; required transfusion of 3 units of pRBCs.  She denies any postoperative concerns. She denies any abnormal vaginal discharge, bleeding, pain or other concerns.   Past Medical History  Diagnosis Date  . Seasonal allergies     Past Surgical History  Procedure Laterality Date  . Tonsillectomy      as a child  . Ear cyst excision    . Laparoscopy N/A 05/30/2015    Procedure: DIAGNOSTIC LAPAROSCOPY. LAPAROTOMY WITH REMOVAL OF ECTOPIC PREGNANCY;  Surgeon: Tereso Newcomer, MD;  Location: [facility];  Service: Gynecology;  Laterality: N/A;    The following portions of the patient's history were reviewed and updated as appropriate: allergies, current medications, past family history, past medical history, past social history, past surgical history and problem list.    Review of Systems:  Pertinent items are noted in HPI. Comprehensive review of systems was otherwise negative.  Objective:  Physical Exam BP 135/77 mmHg  Pulse 82  Temp(Src) 98.4 F (36.9 C) (Oral)  Ht  (1.626 m)  Wt 223 lb 11.2 oz (101.47 kg)  BMI 38.38 kg/m2  LMP 06/10/2015 (Within Days) CONSTITUTIONAL: Well-developed, well-nourished female in no acute distress.  HENT:  Normocephalic, atraumatic. External right and left ear normal. Oropharynx is clear and moist EYES: Conjunctivae and EOM are normal. Pupils are equal, round, and reactive to light. No scleral icterus.  NECK: Normal range of motion, supple, no masses SKIN: Skin is warm and dry. No rash noted. Not diaphoretic. No erythema. No pallor. NEUROLGIC: Alert and oriented to person, place, and time. Normal reflexes, muscle tone coordination. No cranial nerve deficit noted. PSYCHIATRIC: Normal mood and affect. Normal behavior. Normal judgment and thought  content. CARDIOVASCULAR: Normal heart rate noted RESPIRATORY: Effort and breath sounds normal, no problems with respiration noted ABDOMEN: Soft, no distention noted, well-healing Pfannenstiel incision   PELVIC: Deferred MUSCULOSKELETAL: Normal range of motion. No edema noted.  Labs and Imaging 05/30/15 Surgical Pathology  Fallopian tube, ectopic pregnancy, ruptured, with right fallopian tube - DILATED FALLOPIAN TUBE WITH FETAL TISSUE WITH FOOT LENGTH OF 0.4 CM, CONSISTENT WITH GESTATIONAL AGE OF APPROXIMATELY 8 WEEKS.  Assessment & Plan:  Normal postoperative visit Pathology reviewed Contraceptive counseling done; OCPs prescribed Routine preventative health maintenance measures emphasized, encouraged to go to [facility]. Please refer to After Visit Summary for other counseling recommendations.     Jaynie Collins, MD, FACOG Attending Obstetrician & Gynecologist, [facility] and Center for [facility]

## 2015-07-01 NOTE — Patient Instructions (Signed)
Oral Contraception Use Oral contraceptive pills (OCPs) are medicines taken to prevent pregnancy. OCPs work by preventing the ovaries from releasing eggs. The hormones in OCPs also cause the cervical mucus to thicken, preventing the sperm from entering the uterus. The hormones also cause the uterine lining to become thin, not allowing a fertilized egg to attach to the inside of the uterus. OCPs are highly effective when taken exactly as prescribed. However, OCPs do not prevent sexually transmitted diseases (STDs). Safe sex practices, such as using condoms along with an OCP, can help prevent STDs. Before taking OCPs, you may have a physical exam and Pap test. Your health care provider may also order blood tests if necessary. Your health care provider will make sure you are a good candidate for oral contraception. Discuss with your health care provider the possible side effects of the OCP you may be prescribed. When starting an OCP, it can take 2 to 3 months for the body to adjust to the changes in hormone levels in your body.  HOW TO TAKE ORAL CONTRACEPTIVE PILLS Your health care provider may advise you on how to start taking the first cycle of OCPs. Otherwise, you can:   Start on day 1 of your menstrual period. You will not need any backup contraceptive protection with this start time.   Start on the first Sunday after your menstrual period or the day you get your prescription. In these cases, you will need to use backup contraceptive protection for the first week.   Start the pill at any time of your cycle. If you take the pill within 5 days of the start of your period, you are protected against pregnancy right away. In this case, you will not need a backup form of birth control. If you start at any other time of your menstrual cycle, you will need to use another form of birth control for 7 days. If your OCP is the type called a minipill, it will protect you from pregnancy after taking it for 2 days (48  hours). After you have started taking OCPs:   If you forget to take 1 pill, take it as soon as you remember. Take the next pill at the regular time.   If you miss 2 or more pills, call your health care provider because different pills have different instructions for missed doses. Use backup birth control until your next menstrual period starts.   If you use a 28-day pack that contains inactive pills and you miss 1 of the last 7 pills (pills with no hormones), it will not matter. Throw away the rest of the non-hormone pills and start a new pill pack.  No matter which day you start the OCP, you will always start a new pack on that same day of the week. Have an extra pack of OCPs and a backup contraceptive method available in case you miss some pills or lose your OCP pack.  HOME CARE INSTRUCTIONS   Do not smoke.   Always use a condom to protect against STDs. OCPs do not protect against STDs.   Use a calendar to mark your menstrual period days.   Read the information and directions that came with your OCP. Talk to your health care provider if you have questions.  SEEK MEDICAL CARE IF:   You develop nausea and vomiting.   You have abnormal vaginal discharge or bleeding.   You develop a rash.   You miss your menstrual period.   You are losing   your hair.   You need treatment for mood swings or depression.   You get dizzy when taking the OCP.   You develop acne from taking the OCP.   You become pregnant.  SEEK IMMEDIATE MEDICAL CARE IF:   You develop chest pain.   You develop shortness of breath.   You have an uncontrolled or severe headache.   You develop numbness or slurred speech.   You develop visual problems.   You develop pain, redness, and swelling in the legs.  Document Released: 09/22/2011 Document Revised: 02/17/2014 Document Reviewed: 03/24/2013 [employer] Patient Information 2015 [employer]. This information is not intended to replace  advice given to you by your health care provider. Make sure you discuss any questions you have with your health care provider.  

## 2015-09-02 ENCOUNTER — Encounter: Payer: Self-pay | Admitting: Obstetrics & Gynecology

## 2015-09-02 ENCOUNTER — Ambulatory Visit (INDEPENDENT_AMBULATORY_CARE_PROVIDER_SITE_OTHER): Payer: Self-pay | Admitting: Obstetrics & Gynecology

## 2015-09-02 VITALS — BP 138/69 | HR 82 | Temp 98.4°F | Resp 20 | Ht 64.5 in | Wt 223.9 lb

## 2015-09-02 DIAGNOSIS — Z4889 Encounter for other specified surgical aftercare: Secondary | ICD-10-CM

## 2015-09-02 NOTE — Progress Notes (Signed)
Pt states she never picked up Rx for OCP's.  Pt states she feels a "knot" in her lower abdomen near the incision.

## 2015-09-02 NOTE — Progress Notes (Signed)
   CLINIC ENCOUNTER NOTE  History:  24 y.o. G1P0010 here today for OCP follow up, never started taking OCPs.  Reports knot near incision; had ELAP on 05/30/15.  She denies any abnormal vaginal discharge, bleeding, pelvic pain or other concerns.   Past Medical History  Diagnosis Date  . Seasonal allergies     Past Surgical History  Procedure Laterality Date  . Tonsillectomy      as a child  . Ear cyst excision    . Laparoscopy N/A 05/30/2015    Procedure: DIAGNOSTIC LAPAROSCOPY. LAPAROTOMY WITH REMOVAL OF ECTOPIC PREGNANCY;  Surgeon: Tereso NewcomerUgonna A Vernie Piet, MD;  Location: [facility];  Service: Gynecology;  Laterality: N/A;    The following portions of the patient's history were reviewed and updated as appropriate: allergies, current medications, past family history, past medical history, past social history, past surgical history and problem list.    Review of Systems:  Pertinent items noted in HPI and remainder of comprehensive ROS otherwise negative.  Objective:  Physical Exam BP 138/69 mmHg  Pulse 82  Temp(Src) 98.4 F (36.9 C) (Oral)  Resp 20  Ht 5' 4.5" (1.638 m)  Wt 223 lb 14.4 oz (101.56 kg)  BMI 37.85 kg/m2  LMP 08/25/2015 CONSTITUTIONAL: Well-developed, well-nourished female in no acute distress.  HENT:  Normocephalic, atraumatic. External right and left ear normal. Oropharynx is clear and moist EYES: Conjunctivae and EOM are normal. Pupils are equal, round, and reactive to light. No scleral icterus.  NECK: Normal range of motion, supple, no masses SKIN: Skin is warm and dry. No rash noted. Not diaphoretic. No erythema. No pallor. NEUROLGIC: Alert and oriented to person, place, and time. Normal reflexes, muscle tone coordination. No cranial nerve deficit noted. PSYCHIATRIC: Normal mood and affect. Normal behavior. Normal judgment and thought content. CARDIOVASCULAR: Normal heart rate noted RESPIRATORY: Effort and breath sounds normal, no problems with respiration  noted ABDOMEN: Soft, no distention noted.  Normal transverse incision, no knot palpated. PELVIC: Deferred MUSCULOSKELETAL: Normal range of motion. No edema noted.   Assessment & Plan:  Encounter for postoperative wound check Normal incision check  Patient encouraged to take OCPs or take prenatal vitamins in case conception occurs. Routine preventative health maintenance measures emphasized. Please refer to After Visit Summary for other counseling recommendations.   Total face-to-face time with patient: 15 minutes. Over 50% of encounter was spent on counseling and coordination of care.   Jaynie CollinsUGONNA  Jennavecia Schwier, MD, FACOG Attending Obstetrician & Gynecologist, [facility] and [facility]

## 2015-10-13 IMAGING — US US OB COMP LESS 14 WK
1 series · 13 of 28 positions shown · non-contrast
Comparison: None.

CLINICAL DATA: Early pregnancy with pelvic plane and vaginal
bleeding.

EXAM:
OBSTETRIC <14 WK US AND TRANSVAGINAL OB US
TECHNIQUE: Both transabdominal and transvaginal ultrasound examinations were
performed for complete evaluation of the gestation as well as the
maternal uterus, adnexal regions, and pelvic cul-de-sac.
Transvaginal technique was performed to assess early pregnancy.

[Series 1: us ob comp less 14 wk · 0.25mm/px · 13 of 65 slices shown]
[im 3/65]
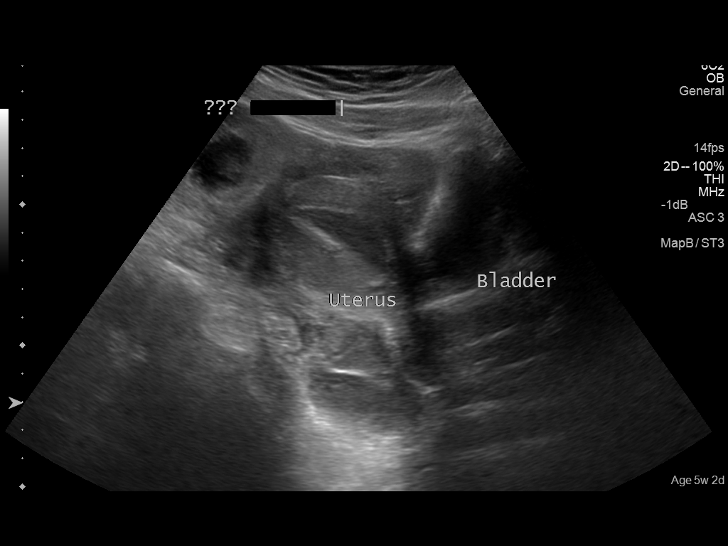
[im 8/65]
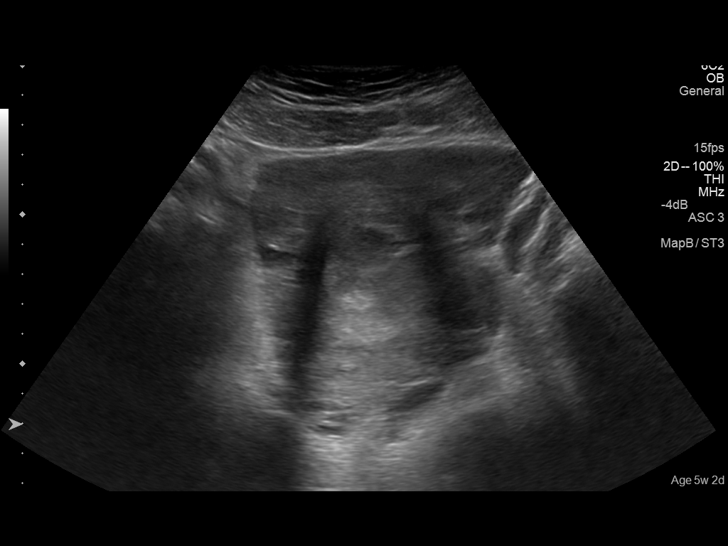
[im 12/65]
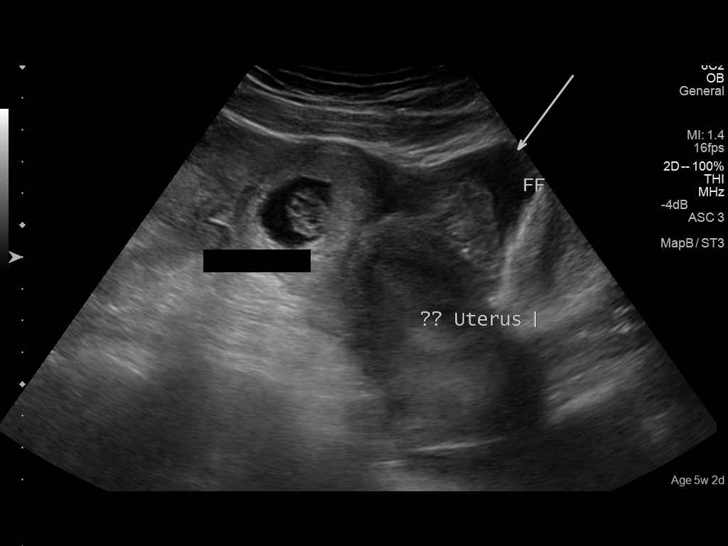
[im 17/65]
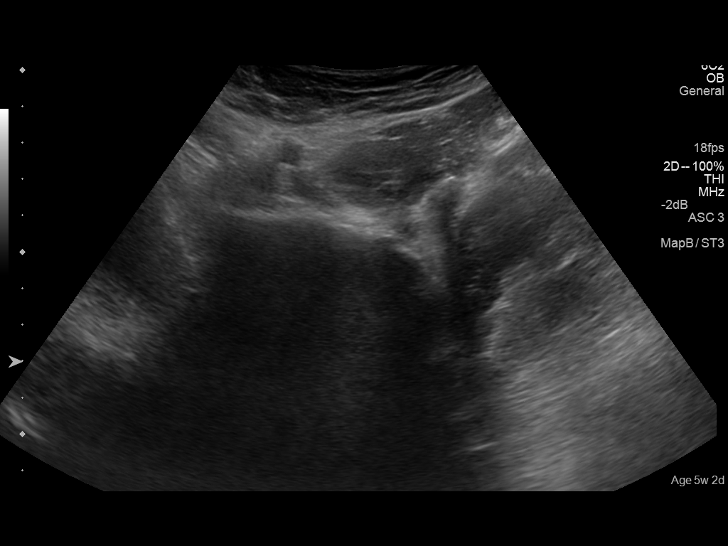
[im 22/65]
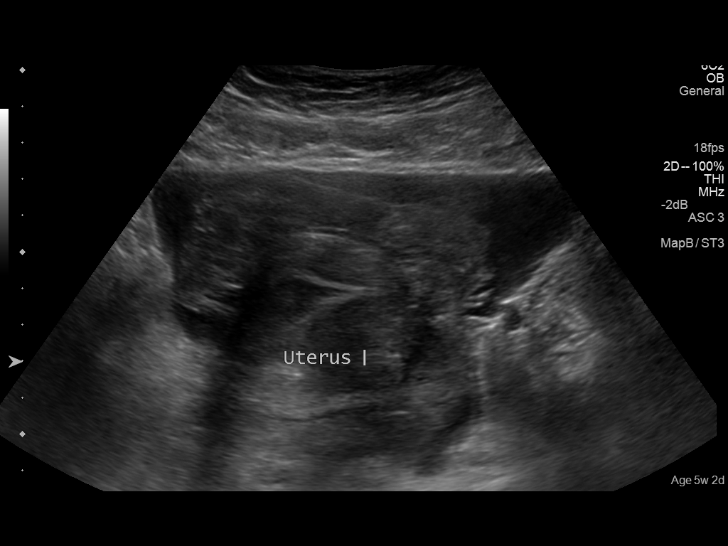
[im 27/65]
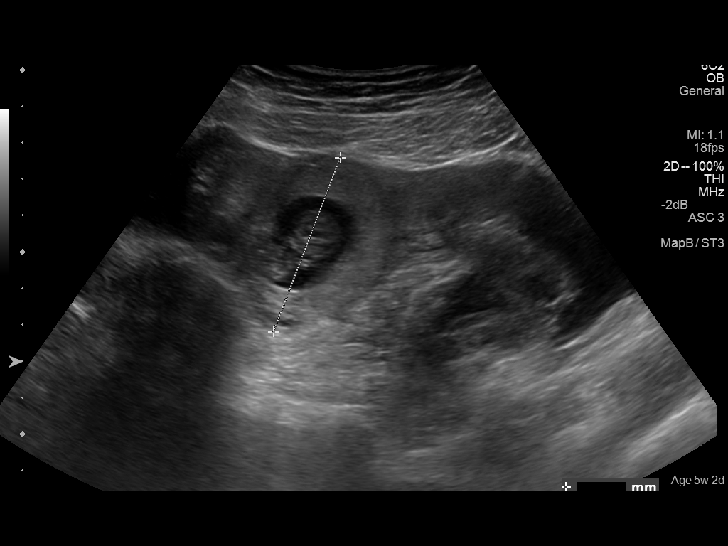
[im 34/65]
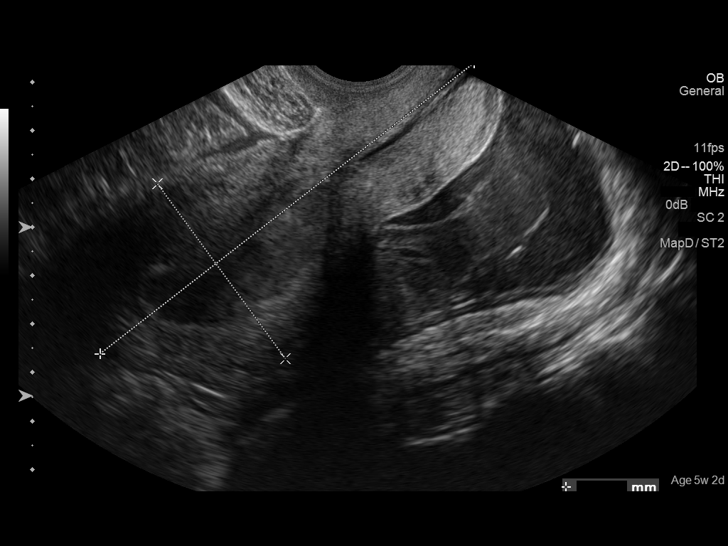
[im 38/65]
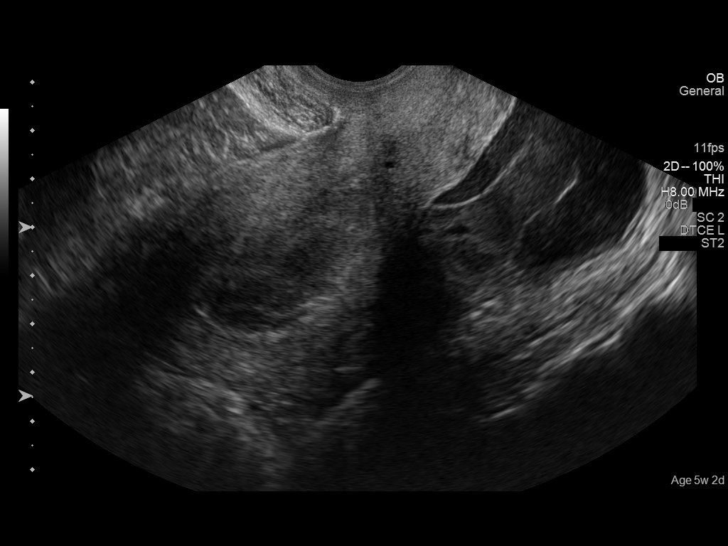
[im 43/65]
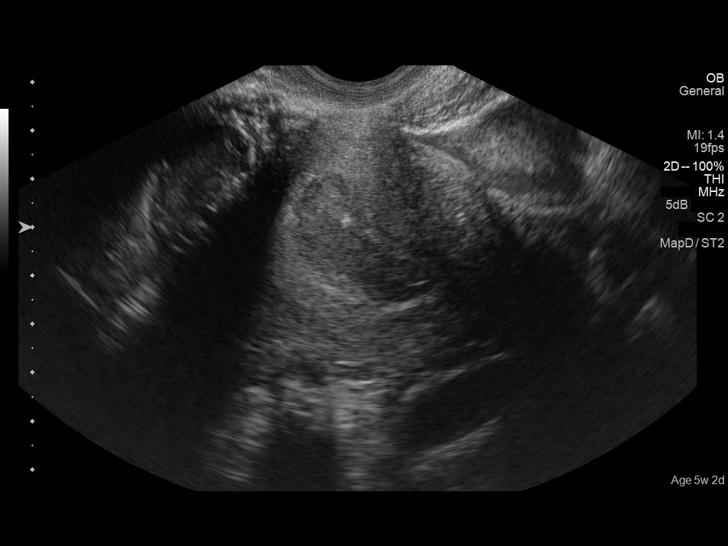
[im 48/65]
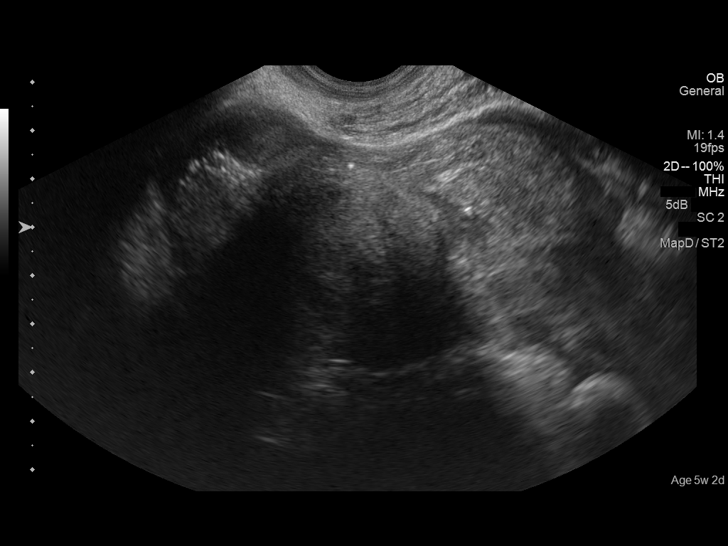
[im 53/65]
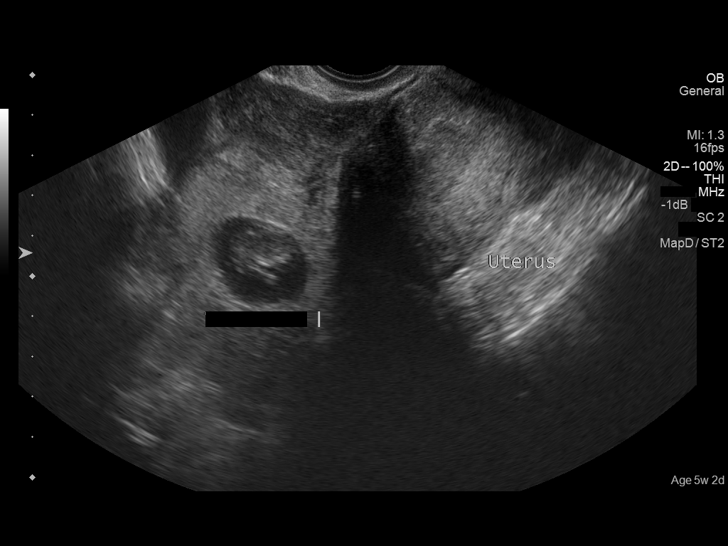
[im 57/65]
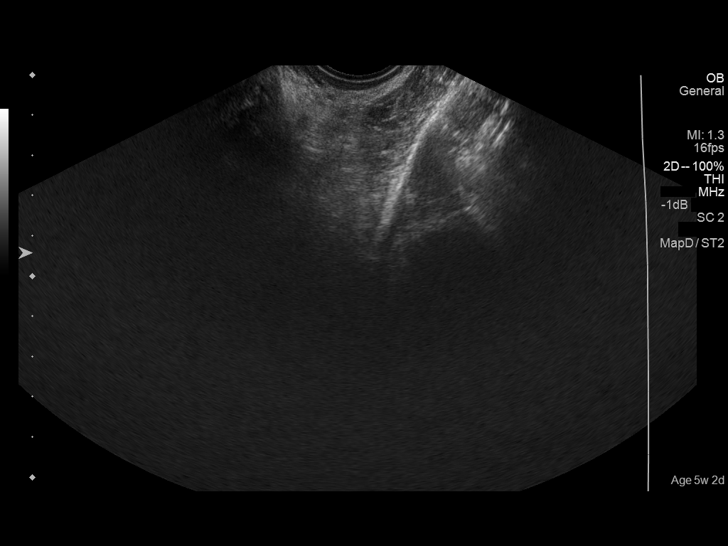
[im 62/65]
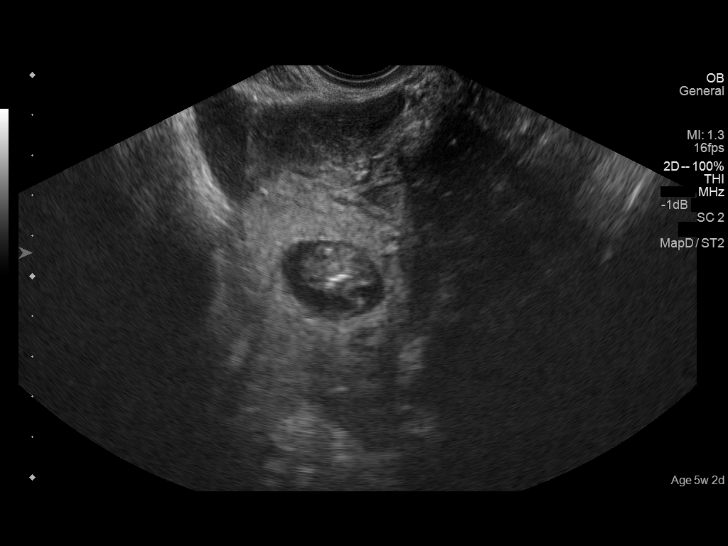

[13 of 28 positions shown; findings below may reference images not displayed]

FINDINGS: Intrauterine gestational sac: No visualized intrauterine gestation.

There is a gestational sac with surrounding soft tissue thickening
adjacent to the uterus in the region of the right fallopian tube or
adnexa. A distorted appearing embryo is present without detectable
cardiac activity. No yolk sac is seen.

CRL:  22.7  mm   9 w   0 d

The uterus measures approximately 9.1 x 4.8 x 5.0 cm. There is some
thickening of the endometrium, measuring 13 mm. The true right and
left ovaries are not visualized on the study. There is a moderate to
large amount of free fluid in the pelvis which appears echogenic and
likely represents hemorrhagic fluid.
IMPRESSION: Findings positive for right-sided tubal/adnexal ectopic pregnancy.
Discrete gestational sac is present outside of the uterus with an
embryo identified. No cardiac activity is seen. Estimated
gestational age of the embryo is 9 weeks. Associated complex fluid
in the pelvis likely represents hemorrhagic fluid.

Critical Value/emergent results were called by telephone at the time
of interpretation on 05/30/2015 at [DATE] to Dr. TJIHINGA GRANNY, who
verbally acknowledged these results.

## 2019-10-25 ENCOUNTER — Other Ambulatory Visit: Payer: Self-pay

## 2019-10-25 ENCOUNTER — Ambulatory Visit: Payer: MEDICAID | Attending: Internal Medicine

## 2019-10-25 DIAGNOSIS — Z20822 Contact with and (suspected) exposure to covid-19: Secondary | ICD-10-CM

## 2019-10-27 LAB — NOVEL CORONAVIRUS, NAA: SARS-CoV-2, NAA: NOT DETECTED

## 2020-12-01 ENCOUNTER — Ambulatory Visit: Payer: Self-pay
